# Patient Record
Sex: Female | Born: 1980 | Race: Black or African American | Hispanic: No | Marital: Single | Smoking: Never smoker
Health system: Southern US, Community
[De-identification: ages and names within clinical notes are randomized; demographics above are authoritative.]

## PROBLEM LIST (undated history)

## (undated) HISTORY — PX: CERVICAL BIOPSY  W/ LOOP ELECTRODE EXCISION: SUR135

## (undated) HISTORY — PX: WISDOM TOOTH EXTRACTION: SHX21

---
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
## 2001-05-26 ENCOUNTER — Inpatient Hospital Stay (HOSPITAL_COMMUNITY): Admission: AD | Admit: 2001-05-26 | Discharge: 2001-05-26 | Payer: Self-pay | Admitting: Obstetrics and Gynecology

## 2001-06-28 ENCOUNTER — Other Ambulatory Visit: Admission: RE | Admit: 2001-06-28 | Discharge: 2001-06-28 | Payer: Self-pay | Admitting: Obstetrics and Gynecology

## 2001-09-19 ENCOUNTER — Other Ambulatory Visit: Admission: RE | Admit: 2001-09-19 | Discharge: 2001-09-19 | Payer: Self-pay | Admitting: Obstetrics and Gynecology

## 2001-11-28 ENCOUNTER — Encounter (INDEPENDENT_AMBULATORY_CARE_PROVIDER_SITE_OTHER): Payer: Self-pay | Admitting: *Deleted

## 2001-11-28 ENCOUNTER — Ambulatory Visit (HOSPITAL_COMMUNITY): Admission: RE | Admit: 2001-11-28 | Discharge: 2001-11-28 | Payer: Self-pay | Admitting: Obstetrics and Gynecology

## 2002-03-06 ENCOUNTER — Encounter: Payer: Self-pay | Admitting: Emergency Medicine

## 2002-03-06 ENCOUNTER — Emergency Department (HOSPITAL_COMMUNITY): Admission: EM | Admit: 2002-03-06 | Discharge: 2002-03-06 | Payer: Self-pay | Admitting: Emergency Medicine

## 2004-01-09 ENCOUNTER — Inpatient Hospital Stay (HOSPITAL_COMMUNITY): Admission: AD | Admit: 2004-01-09 | Discharge: 2004-01-09 | Payer: Self-pay | Admitting: Obstetrics & Gynecology

## 2005-04-06 ENCOUNTER — Inpatient Hospital Stay (HOSPITAL_COMMUNITY): Admission: AD | Admit: 2005-04-06 | Discharge: 2005-04-06 | Payer: Self-pay | Admitting: Obstetrics & Gynecology

## 2005-07-23 ENCOUNTER — Other Ambulatory Visit: Admission: RE | Admit: 2005-07-23 | Discharge: 2005-07-23 | Payer: Self-pay | Admitting: Obstetrics and Gynecology

## 2007-03-07 ENCOUNTER — Inpatient Hospital Stay (HOSPITAL_COMMUNITY): Admission: AD | Admit: 2007-03-07 | Discharge: 2007-03-07 | Payer: Self-pay | Admitting: Obstetrics & Gynecology

## 2007-07-13 ENCOUNTER — Inpatient Hospital Stay (HOSPITAL_COMMUNITY): Admission: AD | Admit: 2007-07-13 | Discharge: 2007-07-13 | Payer: Self-pay | Admitting: Obstetrics & Gynecology

## 2007-07-15 ENCOUNTER — Inpatient Hospital Stay (HOSPITAL_COMMUNITY): Admission: AD | Admit: 2007-07-15 | Discharge: 2007-07-15 | Payer: Self-pay | Admitting: Obstetrics and Gynecology

## 2007-09-27 ENCOUNTER — Inpatient Hospital Stay (HOSPITAL_COMMUNITY): Admission: AD | Admit: 2007-09-27 | Discharge: 2007-10-01 | Payer: Self-pay | Admitting: Obstetrics and Gynecology

## 2008-03-08 ENCOUNTER — Inpatient Hospital Stay (HOSPITAL_COMMUNITY): Admission: AD | Admit: 2008-03-08 | Discharge: 2008-03-08 | Payer: Self-pay | Admitting: Obstetrics & Gynecology

## 2008-05-02 ENCOUNTER — Inpatient Hospital Stay (HOSPITAL_COMMUNITY): Admission: AD | Admit: 2008-05-02 | Discharge: 2008-05-02 | Payer: Self-pay | Admitting: Obstetrics & Gynecology

## 2008-07-27 ENCOUNTER — Inpatient Hospital Stay (HOSPITAL_COMMUNITY): Admission: AD | Admit: 2008-07-27 | Discharge: 2008-07-27 | Payer: Self-pay | Admitting: Obstetrics and Gynecology

## 2008-11-19 ENCOUNTER — Inpatient Hospital Stay (HOSPITAL_COMMUNITY): Admission: AD | Admit: 2008-11-19 | Discharge: 2008-11-19 | Payer: Self-pay | Admitting: Obstetrics & Gynecology

## 2008-11-29 ENCOUNTER — Encounter: Payer: Self-pay | Admitting: Family

## 2008-11-29 ENCOUNTER — Ambulatory Visit: Payer: Self-pay | Admitting: Obstetrics & Gynecology

## 2008-11-29 LAB — CONVERTED CEMR LAB
HCT: 34.5 % — ABNORMAL LOW (ref 36.0–46.0)
MCHC: 31.6 g/dL (ref 30.0–36.0)
MCV: 88.9 fL (ref 78.0–100.0)
Platelets: 206 10*3/uL (ref 150–400)
RBC: 3.88 M/uL (ref 3.87–5.11)

## 2008-12-12 ENCOUNTER — Ambulatory Visit: Payer: Self-pay | Admitting: Obstetrics and Gynecology

## 2008-12-19 ENCOUNTER — Ambulatory Visit: Payer: Self-pay | Admitting: Obstetrics & Gynecology

## 2008-12-26 ENCOUNTER — Ambulatory Visit: Payer: Self-pay | Admitting: Obstetrics & Gynecology

## 2008-12-30 ENCOUNTER — Ambulatory Visit: Payer: Self-pay | Admitting: Obstetrics and Gynecology

## 2008-12-30 ENCOUNTER — Inpatient Hospital Stay (HOSPITAL_COMMUNITY): Admission: AD | Admit: 2008-12-30 | Discharge: 2009-01-01 | Payer: Self-pay | Admitting: Obstetrics and Gynecology

## 2009-03-30 ENCOUNTER — Emergency Department (HOSPITAL_COMMUNITY): Admission: EM | Admit: 2009-03-30 | Discharge: 2009-03-30 | Payer: Self-pay | Admitting: Emergency Medicine

## 2009-07-01 ENCOUNTER — Emergency Department (HOSPITAL_COMMUNITY): Admission: EM | Admit: 2009-07-01 | Discharge: 2009-07-02 | Payer: Self-pay | Admitting: Emergency Medicine

## 2009-11-16 ENCOUNTER — Emergency Department (HOSPITAL_COMMUNITY): Admission: EM | Admit: 2009-11-16 | Discharge: 2009-11-16 | Payer: Self-pay | Admitting: Emergency Medicine

## 2010-04-18 ENCOUNTER — Emergency Department (HOSPITAL_COMMUNITY)
Admission: EM | Admit: 2010-04-18 | Discharge: 2010-04-18 | Payer: Self-pay | Source: Home / Self Care | Admitting: Emergency Medicine

## 2010-04-19 LAB — STREP A DNA PROBE: Group A Strep Probe: NEGATIVE

## 2010-06-06 LAB — RAPID STREP SCREEN (MED CTR MEBANE ONLY): Streptococcus, Group A Screen (Direct): NEGATIVE

## 2010-06-26 LAB — POCT URINALYSIS DIP (DEVICE)
Bilirubin Urine: NEGATIVE
Glucose, UA: NEGATIVE mg/dL
Hgb urine dipstick: NEGATIVE
Nitrite: NEGATIVE
Urobilinogen, UA: 1 mg/dL (ref 0.0–1.0)

## 2010-06-26 LAB — CBC
Hemoglobin: 12.6 g/dL (ref 12.0–15.0)
MCHC: 33.3 g/dL (ref 30.0–36.0)
RBC: 4.23 MIL/uL (ref 3.87–5.11)
WBC: 11.3 10*3/uL — ABNORMAL HIGH (ref 4.0–10.5)

## 2010-06-26 LAB — RPR: RPR Ser Ql: NONREACTIVE

## 2010-06-27 LAB — POCT URINALYSIS DIP (DEVICE)
Bilirubin Urine: NEGATIVE
Bilirubin Urine: NEGATIVE
Bilirubin Urine: NEGATIVE
Glucose, UA: NEGATIVE mg/dL
Glucose, UA: NEGATIVE mg/dL
Hgb urine dipstick: NEGATIVE
Hgb urine dipstick: NEGATIVE
Ketones, ur: NEGATIVE mg/dL
Ketones, ur: NEGATIVE mg/dL
Ketones, ur: NEGATIVE mg/dL
Nitrite: NEGATIVE
Protein, ur: NEGATIVE mg/dL
Specific Gravity, Urine: 1.02 (ref 1.005–1.030)
Specific Gravity, Urine: 1.02 (ref 1.005–1.030)
Specific Gravity, Urine: 1.01 (ref 1.005–1.030)
pH: 6 (ref 5.0–8.0)
pH: 6.5 (ref 5.0–8.0)

## 2010-06-28 LAB — GC/CHLAMYDIA PROBE AMP, GENITAL
Chlamydia, DNA Probe: NEGATIVE
GC Probe Amp, Genital: NEGATIVE

## 2010-06-28 LAB — WET PREP, GENITAL
Clue Cells Wet Prep HPF POC: NONE SEEN
Trich, Wet Prep: NONE SEEN
Yeast Wet Prep HPF POC: NONE SEEN

## 2010-07-08 LAB — CBC
HCT: 38.4 % (ref 36.0–46.0)
MCV: 92.2 fL (ref 78.0–100.0)
Platelets: 280 10*3/uL (ref 150–400)
RDW: 13.3 % (ref 11.5–15.5)
WBC: 7.7 10*3/uL (ref 4.0–10.5)

## 2010-07-08 LAB — HCG, QUANTITATIVE, PREGNANCY: hCG, Beta Chain, Quant, S: 6420 m[IU]/mL — ABNORMAL HIGH (ref <5)

## 2010-07-08 LAB — POCT PREGNANCY, URINE: Preg Test, Ur: POSITIVE

## 2010-08-05 NOTE — H&P (Signed)
Frances Hall, MACAPAGAL            ACCOUNT NO.:  [id number]   MEDICAL RECORD NO.:  [id number]          PATIENT TYPE:  INP   LOCATION:  9160                          FACILITY:  [facility]   PHYSICIAN:  Duke Salvia. Marcelle Overlie, M.D.DATE OF BIRTH:  Jun 30, 1980   DATE OF ADMISSION:  09/27/2007  DATE OF DISCHARGE:                              HISTORY & PHYSICAL   CHIEF COMPLAINT:  Elevated blood pressure.   HISTORY OF PRESENT ILLNESS:  A 30 year old G1 P0, EDD is 07/31, EGA is  36-1/2 weeks, seen in the office earlier today, BP 140/86, 1+ protein,  having some mild headaches.  Was sent to MAU for evaluation where LFTs  and platelet count were normal.  The serial blood pressures were 136/98  and others with diastolics in the high 90s with 1-2+ protein noted on  dipstick.  NST was reactive.  She is admitted now for two-stage  induction for mild preeclampsia.   Blood type is A+, rubella titer is immune.  GBS has not been done.   PAST MEDICAL HISTORY:  Please see her [facility] form for details.  She  has had mild dysplasia noted on early pregnancy Pap smear.   ALLERGIES:  None   PHYSICAL EXAMINATION:  VITAL SIGNS:  Temperature 98.2, blood pressure  136/98.  HEENT:  Unremarkable.  Supple without masses.  LUNGS:  Clear.  CARDIOVASCULAR:  Regular rate and rhythm without murmurs, rubs, gallops  noted.  BREASTS:  Not examined.  ABDOMEN:  Term fundal height.  Fetal heart rate 140.  Cervix was closed,  50% vertex, -2, membranes intact.  EXTREMITIES:  Reveal 1-2+ edema.  Reflexes 1-2+, no clonus.   IMPRESSION:  1. A 36-1/2-week intrauterine pregnancy.  2. Mild pregnancy induced hypertension.   PLAN:  Will admit for two-stage labor induction.      Richard M. Marcelle Overlie, M.D.  Electronically Signed     RMH/MEDQ  D:  09/27/2007  T:  09/27/2007  Job:  [id number]   cc:   L&D Chart

## 2010-08-08 NOTE — Discharge Summary (Signed)
Frances Hall, LOKEY NO.:  [id number]   MEDICAL RECORD NO.:  [id number]          PATIENT TYPE:  INP   LOCATION:  9107                          [facility]ACILITY:  [facility]   PHYSICIAN:  Juluis Mire, M.D.   DATE OF BIRTH:  [birth year]   DATE OF ADMISSION:  09/27/2007  DATE OF DISCHARGE:  10/01/2007                               DISCHARGE SUMMARY   ADMITTING DIAGNOSES:  1. Intrauterine pregnancy at 36-1/2 weeks estimated gestational age.  2. Pregnancy-induced hypertension.  3. Two-stage induction of labor.   DISCHARGE DIAGNOSES:  1. Status post spontaneous vaginal delivery.  2. Viable female infant.   PROCEDURES:  Spontaneous vaginal delivery with midline episiotomy with  repair.   REASON FOR ADMISSION:  Please see dictated H&P.   HOSPITAL COURSE:  The patient is a 30 year old primigravida who was  admitted to [facility] for two-stage induction of labor  after being evaluated in the office with blood pressure noted to be  140/86 of 1+ proteinuria.  The patient did complain of some mild  headache.  The patient was sent to the MAU area, where [facility] labs were  drawn and liver function tests and platelet count were within normal  limits.  Serial blood pressures were 136/98, diastolics were noted to be  in the 90s.  The patient was noted to have 1 to 2+ proteinuria dipstick.  NST was reactive.  The patient was now admitted for two-stage induction  of labor for mild preeclampsia.  On the following morning, the patient  was without complaint.  Vital signs were stable.  Blood pressure was  120s/70s.  Deep tendon reflexes were 2+.  Cervix was noted to be closed  on the internal os posterior and 60% effaced vertex at a -3 station.  The patient was now started on Pitocin for augmentation of her labor.  Throughout the day, fetal heart tones remained reactive.  Contractions  were somewhat irregular.  PIH labs had been redrawn which were within  normal limits.  Cervix  was essentially unchanged.  On the following  morning, the patient was somewhat uncomfortable with her contractions.  Blood pressures were 130-150s over 70s-90s.  The fetal heart tones  continued to be reactive.  The cervix was now 2 cm, 60% effaced, vertex  at a -2 station.  Artificial rupture of membranes was performed which  revealed clear fluid.  Internal uterine pressure catheter was placed  without difficulty.  Later that afternoon, physician was contacted  regarding while she was completely dilated and the patient was now  pushing.  Fetal heart tones began to have some prolonged deceleration  into the 90s.  The patient was able to push the vertex to a +3 station.  Consent was obtained and vacuum extractor was applied with gentle  traction.  Over the next 3 contractions, midline episiotomy was cut and  the fetus was delivered spontaneously.  With the next two contraction,  placenta was delivered spontaneously and intact.  A second-degree repair  was performed with 3-0 Vicryl.  Infant was now taken to the nursery with  Apgars of 8 at 1 minute  and 9 at 5 minutes.  Arterial cord pH was 7.20.   On postpartum day #1, the patient was without complaint.  Vital signs  were stable.  Blood pressure 140/93 to 139/85.  She denied headache,  blurred vision, and right upper quadrant pain.  Deep tendon reflexes are  2+ with one beat of clonus on the left.  Fundus firm and nontender.  Perineum was intact.  Laboratory findings revealed a hemoglobin of 9.0,  platelet count of 190,000, and WBC of 9.8.  The patient was on  labetalol, which was continued and PIH labs were ordered for the  following morning.   On postpartum day #2, the patient was without complaint.  She denied  headache, blurred vision, and right upper quadrant pain.  Blood pressure  was 140/90.  Vital signs were, otherwise, stable.  Fundus firm and  nontender.  PIH labs were within normal limits.  Deep tendon reflexes  are 1+.   Discharge instructions were reviewed and the patient was later  discharged home.   CONDITION ON DISCHARGE:  Stable.   DIET:  Regular as tolerated.   ACTIVITY:  No vaginal entry.   She is to call for headache, blurred vision, or right upper quadrant  pain.   FOLLOWUP:  She is to follow up in the office in approximately 2-3 days  for blood pressure check.   DISCHARGE MEDICATIONS:  1. Tylox #30 1 p.o. every 4 hours as needed for pain.  2. Prenatal vitamins 1 p.o. daily.      Frances Hall, N.P.      Juluis Mire, M.D.  Electronically Signed    CC/MEDQ  D:  10/21/2007  T:  10/22/2007  Job:  [id number]

## 2010-08-08 NOTE — Op Note (Signed)
   NAME:  Frances Hall, Frances Hall                      ACCOUNT NO.:  [id number]   MEDICAL RECORD NO.:  [id number]                   PATIENT TYPE:  AMB   LOCATION:  SDC                                  [facility]ILITY:  [facility]   PHYSICIAN:  Crist Fat. Rivard, M.D.              DATE OF BIRTH:  02-May-1980   DATE OF PROCEDURE:  11/28/2001  DATE OF DISCHARGE:                                 OPERATIVE REPORT   PREOPERATIVE DIAGNOSIS:  Moderate cervical dysplasia, cervical  intraepithelial neoplasia 2.   POSTOPERATIVE DIAGNOSIS:  Moderate cervical dysplasia, cervical  intraepithelial neoplasia 2.   PROCEDURE:  Loop electrosurgical excision procedure.   SURGEON:  Crist Fat. Rivard, M.D.   ANESTHESIA:  Intravenous sedation with paracervical block.   ESTIMATED BLOOD LOSS:  Minimal.   DESCRIPTION OF PROCEDURE:  After being informed of the planned procedure  with possible complications including bleeding, infection, cervical  stenosis, and cervical incompetence, informed consent is obtained.  The  patient is taken to operating room #8 and given IV sedation, placed in the  lithotomy position.  She was prepped and draped in a sterile fashion.  A  speculum is inserted, and her cervix is swabbed with acetic acid.  Colposcopy reveals the previously mentioned lesions with predominantly an  anterior lip lesion going towards the cervical canal.  We proceeded with a  paracervical block using 10 cc of lidocaine 1% with epinephrine 1:100,000,  10 cc in the usual fashion.  Using a small loop, we proceeded with LEEP  excision, first of the anterior lip, and then of the anterior lip of the  cervix.  Hemostasis was completed with cauterization of the resection bed as  well as application of Monsel's solution.  Hemostasis was adequate.  Instruments are removed.  Instrument and sponge count is complete x2.  Estimated blood loss is minimal.  The procedure is well tolerated by the  patient who is taken to the recovery room  in well and stable condition.  Both specimens were tagged and labeled for the pathologist.                                               [name], M.D.    SAR/MEDQ  D:  11/28/2001  T:  11/28/2001  Job:  [id number]

## 2010-12-18 LAB — CBC
HCT: 26 — ABNORMAL LOW
HCT: 26.4 — ABNORMAL LOW
HCT: 31 — ABNORMAL LOW
HCT: 33.4 — ABNORMAL LOW
HCT: 33.5 — ABNORMAL LOW
Hemoglobin: 11.1 — ABNORMAL LOW
Hemoglobin: 8.9 — ABNORMAL LOW
Hemoglobin: 9 — ABNORMAL LOW
MCHC: 34.1
MCHC: 34.2
MCV: 86.7
MCV: 86.9
MCV: 87.3
Platelets: 193
Platelets: 213
Platelets: 218
RBC: 2.98 — ABNORMAL LOW
RBC: 3.01 — ABNORMAL LOW
RDW: 15.4
RDW: 15.6 — ABNORMAL HIGH
WBC: 10
WBC: 10.4
WBC: 10.8 — ABNORMAL HIGH
WBC: 9.1

## 2010-12-18 LAB — URINALYSIS, ROUTINE W REFLEX MICROSCOPIC
Glucose, UA: NEGATIVE
Ketones, ur: 15 — AB
Protein, ur: 100 — AB
Urobilinogen, UA: 1

## 2010-12-18 LAB — COMPREHENSIVE METABOLIC PANEL
ALT: 12
AST: 17
Albumin: 1.9 — ABNORMAL LOW
Albumin: 2.3 — ABNORMAL LOW
Albumin: 2.6 — ABNORMAL LOW
Alkaline Phosphatase: 127 — ABNORMAL HIGH
Alkaline Phosphatase: 96
BUN: 4 — ABNORMAL LOW
BUN: 5 — ABNORMAL LOW
BUN: 8
CO2: 21
CO2: 23
Calcium: 8.6
Calcium: 8.7
Chloride: 107
Chloride: 109
Chloride: 109
Creatinine, Ser: 0.51
Creatinine, Ser: 0.79
GFR calc Af Amer: >60
GFR calc non Af Amer: >60
GFR calc non Af Amer: >60
GFR calc non Af Amer: >60
Glucose, Bld: 104 — ABNORMAL HIGH
Glucose, Bld: 99
Potassium: 3.4 — ABNORMAL LOW
Potassium: 4.1
Sodium: 139
Total Bilirubin: 0.9
Total Bilirubin: 1.1
Total Bilirubin: 1.1
Total Protein: 4.5 — ABNORMAL LOW

## 2010-12-18 LAB — LACTATE DEHYDROGENASE: LDH: 174

## 2010-12-18 LAB — URIC ACID
Uric Acid, Serum: 5.1
Uric Acid, Serum: 5.9

## 2010-12-18 LAB — STREP B DNA PROBE

## 2010-12-18 LAB — URINE MICROSCOPIC-ADD ON

## 2010-12-25 LAB — HCG, SERUM, QUALITATIVE: Preg, Serum: NEGATIVE

## 2010-12-25 LAB — GC/CHLAMYDIA PROBE AMP, GENITAL: GC Probe Amp, Genital: NEGATIVE

## 2010-12-25 LAB — WET PREP, GENITAL
Clue Cells Wet Prep HPF POC: NONE SEEN
Trich, Wet Prep: NONE SEEN
Yeast Wet Prep HPF POC: NONE SEEN

## 2010-12-25 LAB — URINALYSIS, ROUTINE W REFLEX MICROSCOPIC
Bilirubin Urine: NEGATIVE
Glucose, UA: NEGATIVE mg/dL
Hgb urine dipstick: NEGATIVE
Ketones, ur: 15 mg/dL — AB
Nitrite: NEGATIVE
Protein, ur: NEGATIVE mg/dL
Specific Gravity, Urine: 1.025 (ref 1.005–1.030)
Urobilinogen, UA: 0.2 mg/dL (ref 0.0–1.0)
pH: 5.5 (ref 5.0–8.0)

## 2010-12-25 LAB — CBC
HCT: 40.3 % (ref 36.0–46.0)
Platelets: 272 10*3/uL (ref 150–400)
RDW: 14.6 % (ref 11.5–15.5)
WBC: 5.1 10*3/uL (ref 4.0–10.5)

## 2010-12-26 LAB — URINALYSIS, ROUTINE W REFLEX MICROSCOPIC
Hgb urine dipstick: NEGATIVE
Protein, ur: NEGATIVE
Specific Gravity, Urine: 1.02
Urobilinogen, UA: 0.2

## 2010-12-26 LAB — WET PREP, GENITAL: Clue Cells Wet Prep HPF POC: NONE SEEN

## 2010-12-26 LAB — CBC
HCT: 38.4
Hemoglobin: 13.3
RBC: 4.21
WBC: 9.4

## 2010-12-26 LAB — URINE MICROSCOPIC-ADD ON

## 2010-12-26 LAB — POCT PREGNANCY, URINE: Preg Test, Ur: POSITIVE

## 2011-05-19 ENCOUNTER — Encounter (HOSPITAL_COMMUNITY): Payer: Self-pay | Admitting: *Deleted

## 2011-05-19 ENCOUNTER — Emergency Department (HOSPITAL_COMMUNITY)
Admission: EM | Admit: 2011-05-19 | Discharge: 2011-05-19 | Disposition: A | Discharge status: Home / Self Care | Payer: Medicaid Other | Attending: Emergency Medicine | Admitting: Emergency Medicine

## 2011-05-19 DIAGNOSIS — R51 Headache: Secondary | ICD-10-CM | POA: Insufficient documentation

## 2011-05-19 DIAGNOSIS — N390 Urinary tract infection, site not specified: Secondary | ICD-10-CM | POA: Insufficient documentation

## 2011-05-19 DIAGNOSIS — R197 Diarrhea, unspecified: Secondary | ICD-10-CM | POA: Insufficient documentation

## 2011-05-19 DIAGNOSIS — R5381 Other malaise: Secondary | ICD-10-CM | POA: Insufficient documentation

## 2011-05-19 DIAGNOSIS — R109 Unspecified abdominal pain: Secondary | ICD-10-CM | POA: Insufficient documentation

## 2011-05-19 DIAGNOSIS — R5383 Other fatigue: Secondary | ICD-10-CM | POA: Insufficient documentation

## 2011-05-19 LAB — URINALYSIS, ROUTINE W REFLEX MICROSCOPIC
Glucose, UA: NEGATIVE mg/dL
Hgb urine dipstick: NEGATIVE
Protein, ur: NEGATIVE mg/dL
Specific Gravity, Urine: 1.029 (ref 1.005–1.030)
pH: 5.5 (ref 5.0–8.0)

## 2011-05-19 LAB — URINE MICROSCOPIC-ADD ON

## 2011-05-19 LAB — POCT PREGNANCY, URINE: Preg Test, Ur: NEGATIVE

## 2011-05-19 MED ORDER — SULFAMETHOXAZOLE-TRIMETHOPRIM 800-160 MG PO TABS: 1.0000 | ORAL_TABLET | Freq: Two times a day (BID) | ORAL | Status: AC

## 2011-05-19 MED ORDER — ACETAMINOPHEN 325 MG PO TABS
650.0000 mg | ORAL_TABLET | Freq: Once | ORAL | Status: AC
  Administered 2011-05-19: 650 mg via ORAL
  Filled 2011-05-19: qty 2

## 2011-05-19 MED ORDER — ONDANSETRON 8 MG PO TBDP
8.0000 mg | ORAL_TABLET | Freq: Once | ORAL | Status: AC
  Administered 2011-05-19: 8 mg via ORAL
  Filled 2011-05-19: qty 1

## 2011-05-19 NOTE — ED Notes (Signed)
Pt states "they were having diarrhea 1st, then I started getting it, I had the h/a, had nausea"

## 2011-05-19 NOTE — Progress Notes (Signed)
Pt states she and son's pcp is [facility] pediatrician

## 2011-05-19 NOTE — ED Notes (Signed)
Pt tolerating fluids well. 

## 2011-05-19 NOTE — ED Provider Notes (Signed)
History     CSN: [id number]  Arrival date & time 05/19/11  1314   First MD Initiated Contact with Patient 05/19/11 1515      Chief Complaint  Patient presents with  . Headache  . Generalized Body Aches  . Emesis     Patient is a 31 y.o. female presenting with diarrhea. The history is provided by the patient.  Diarrhea The primary symptoms include fatigue, abdominal pain, nausea, diarrhea and myalgias. Episode onset: several days ago. The onset was gradual. The problem has been gradually worsening.  The illness is also significant for chills.  Pt reports fatigue, nonbloody diarrhea, chills/myalgias, mild headache for past several days She also reports mild abdominal cramping No dysuria, no vaginal bleeding +sick contact No significant cough reported  PMH - none  History reviewed. No pertinent past surgical history.  No family history on file.  History  Substance Use Topics  . Smoking status: Passive Smoker  . Smokeless tobacco: Not on file  . Alcohol Use: No    OB History    Grav Para Term Preterm Abortions TAB SAB Ect Mult Living                  Review of Systems  Constitutional: Positive for chills and fatigue.  Gastrointestinal: Positive for nausea, abdominal pain and diarrhea.  Musculoskeletal: Positive for myalgias.    Allergies  Review of patient's allergies indicates no known allergies.  Home Medications  No current outpatient prescriptions on file.  BP 120/53  Pulse 97  Temp 97.8 F (36.6 C)  Resp 20  Wt 130 lb (58.968 kg)  SpO2 99%  Physical Exam CONSTITUTIONAL: Well developed/well nourished HEAD AND FACE: Normocephalic/atraumatic EYES: EOMI/PERRL, no scleral icterus ENMT: Mucous membranes moist NECK: supple no meningeal signs SPINE:entire spine nontender CV: S1/S2 noted, no murmurs/rubs/gallops noted LUNGS: Lungs are clear to auscultation bilaterally, no apparent distress ABDOMEN: soft, nontender, no rebound or guarding, +BS GU:no  cva tenderness NEURO: Pt is awake/alert, moves all extremitiesx4 EXTREMITIES: pulses normal, full ROM SKIN: warm, color normal PSYCH: no abnormalities of mood noted  ED Course  Procedures   Labs Reviewed  URINALYSIS, ROUTINE W REFLEX MICROSCOPIC   3:30 PM Pt with mild headache, diarrhea/fatigue, +sick contacts She is nontoxic and well appearing Will try PO challenge and reassess  UTI noted Pt well appearing, resting comfortably Stable for d/c Discussed strict return precautions  The patient appears reasonably screened and/or stabilized for discharge and I doubt any other medical condition or other [facility] requiring further screening, evaluation, or treatment in the ED at this time prior to discharge.    MDM  Nursing notes reviewed and considered in documentation All labs/vitals reviewed and considered         Joya Gaskins, MD 05/19/11 1806

## 2011-05-19 NOTE — ED Notes (Signed)
Patient given discharge instructions, information, prescriptions, and diet order. Patient states that they adequately understand discharge information given and to return to ED if symptoms return or worsen.     

## 2012-12-14 ENCOUNTER — Emergency Department (HOSPITAL_COMMUNITY)
Admission: EM | Admit: 2012-12-14 | Discharge: 2012-12-14 | Disposition: A | Discharge status: Home / Self Care | Payer: Medicaid Other | Attending: Emergency Medicine | Admitting: Emergency Medicine

## 2012-12-14 ENCOUNTER — Encounter (HOSPITAL_COMMUNITY): Payer: Self-pay | Admitting: Physical Medicine and Rehabilitation

## 2012-12-14 DIAGNOSIS — R300 Dysuria: Secondary | ICD-10-CM | POA: Insufficient documentation

## 2012-12-14 DIAGNOSIS — R609 Edema, unspecified: Secondary | ICD-10-CM | POA: Insufficient documentation

## 2012-12-14 DIAGNOSIS — H9203 Otalgia, bilateral: Secondary | ICD-10-CM

## 2012-12-14 DIAGNOSIS — R350 Frequency of micturition: Secondary | ICD-10-CM

## 2012-12-14 DIAGNOSIS — H9209 Otalgia, unspecified ear: Secondary | ICD-10-CM | POA: Insufficient documentation

## 2012-12-14 DIAGNOSIS — Z3202 Encounter for pregnancy test, result negative: Secondary | ICD-10-CM | POA: Insufficient documentation

## 2012-12-14 LAB — URINALYSIS, ROUTINE W REFLEX MICROSCOPIC
Glucose, UA: NEGATIVE mg/dL
Leukocytes, UA: NEGATIVE
Nitrite: NEGATIVE
Protein, ur: NEGATIVE mg/dL
pH: 5.5 (ref 5.0–8.0)

## 2012-12-14 LAB — POCT PREGNANCY, URINE: Preg Test, Ur: NEGATIVE

## 2012-12-14 NOTE — ED Notes (Signed)
Pt presents to department for evaluation of bilateral earache x1 week and frequent urination. Pt states pain to both ears, no drainage noted. Also states frequent urination, denies vaginal symptoms. 7/10 ear pain at the time. Pt is alert and oriented x4.

## 2012-12-14 NOTE — ED Provider Notes (Signed)
CSN: [id number]     Arrival date & time 12/14/12  0905 History  This chart was scribed for non-physician practitioner Junius Finner, PA-C, working with Loren Racer, MD by Dorothey Baseman, ED Scribe. This patient was seen in room TR05C/TR05C and the patient's care was started at 9:30 AM.    Chief Complaint  Patient presents with  . Otalgia  . Urinary Frequency   The history is provided by the patient. No language interpreter was used.   HPI Comments: Frances Hall is a 32 y.o. female who presents to the Emergency Department complaining of an aching, throbbing pain to the bilateral ears, right worst than left, 7/10 currently, onset 1 week ago. She denies nasal congestion, cough, fever. She states that she has not taken any medications at home to treat the symptoms. She reports mild seasonal allergies.   Patient also reports urinary frequency with associated urethral irritation, dysuria, and cramping onset 2 weeks ago. Patient reports that she has had an asymptomatic bladder infection before. She denies abdominal pain, nausea, vomiting. Denies vaginal discharge or pain. Patient reports that her LMP was 1 week ago. Patient states that she is not currently on oral contraception and that her Implanon ran out in December, 2013.  No past medical history on file. No past surgical history on file. No family history on file. History  Substance Use Topics  . Smoking status: Never Smoker   . Smokeless tobacco: Not on file  . Alcohol Use: No   OB History   Grav Para Term Preterm Abortions TAB SAB Ect Mult Living                 Review of Systems  Constitutional: Negative for fever.  HENT: Positive for ear pain. Negative for congestion.   Respiratory: Negative for cough.   Gastrointestinal: Negative for nausea, vomiting and abdominal pain.  Genitourinary: Positive for dysuria and frequency.  All other systems reviewed and are negative.    Allergies  Review of patient's allergies  indicates no known allergies.  Home Medications  No current outpatient prescriptions on file.   Triage Vitals: BP 106/70  Pulse 66  Temp(Src) 97.5 F (36.4 C) (Oral)  Resp 18  SpO2 100%  Physical Exam  Nursing note and vitals reviewed. Constitutional: She appears well-developed and well-nourished. No distress.  HENT:  Head: Normocephalic and atraumatic.  Right Ear: Hearing, tympanic membrane, external ear and ear canal normal.  Left Ear: Hearing, tympanic membrane, external ear and ear canal normal.  Nose: Mucosal edema present.  Mouth/Throat: Uvula is midline, oropharynx is clear and moist and mucous membranes are normal.  Nl ear canals, nl TMs bilaterally. No erythema or bulging. No cerumen impaction.   Eyes: Conjunctivae are normal. No scleral icterus.  Neck: Normal range of motion.  Cardiovascular: Normal rate, regular rhythm and normal heart sounds.   Pulmonary/Chest: Effort normal and breath sounds normal. No respiratory distress. She has no wheezes. She has no rales. She exhibits no tenderness.  Abdominal: Soft. Bowel sounds are normal. She exhibits no distension and no mass. There is no tenderness. There is no rebound and no guarding.  Soft, non-distended, non-tender.   Musculoskeletal: Normal range of motion.  Neurological: She is alert.  Skin: Skin is warm and dry. She is not diaphoretic.    ED Course  Procedures (including critical care time)  DIAGNOSTIC STUDIES: Oxygen Saturation is 100% on room air, normal by my interpretation.    COORDINATION OF CARE: 9:34AM- Discussed that there are  no signs of an ear infection at this time and that symptoms are likely due to seasonal allergies. Advised patient to take OTC antihistamines and to clean out the ears with a mixture of warm water and peroxide. Discussed treatment plan with patient at bedside and patient verbalized agreement.   9:55AM- Discussed that UA show no signs of infection at this time and that "discomfort"  symptoms could be due to her Implanon running out. Will refer patient to the [facility]. Discussed treatment plan with patient at bedside and patient verbalized agreement.    Labs Review Labs Reviewed  URINALYSIS, ROUTINE W REFLEX MICROSCOPIC  POCT PREGNANCY, URINE   Imaging Review No results found.  MDM   1. Ear pain, bilateral   2. Urinary frequency     I personally performed the services described in this documentation, which was scribed in my presence. The recorded information has been reviewed and is accurate.    Junius Finner, PA-C 12/14/12 1612

## 2012-12-15 NOTE — ED Provider Notes (Signed)
Medical screening examination/treatment/procedure(s) were performed by non-physician practitioner and as supervising physician I was immediately available for consultation/collaboration.   Corliss Lamartina, MD 12/15/12 1251 

## 2013-08-31 ENCOUNTER — Ambulatory Visit (INDEPENDENT_AMBULATORY_CARE_PROVIDER_SITE_OTHER): Payer: Medicaid Other | Admitting: Family Medicine

## 2013-08-31 ENCOUNTER — Encounter: Payer: Self-pay | Admitting: Family Medicine

## 2013-08-31 VITALS — BP 116/49 | HR 78 | Temp 98.5°F | Ht 59.0 in | Wt 134.4 lb

## 2013-08-31 DIAGNOSIS — R5381 Other malaise: Secondary | ICD-10-CM

## 2013-08-31 DIAGNOSIS — Z0000 Encounter for general adult medical examination without abnormal findings: Secondary | ICD-10-CM

## 2013-08-31 DIAGNOSIS — R5383 Other fatigue: Principal | ICD-10-CM

## 2013-08-31 HISTORY — DX: Encounter for general adult medical examination without abnormal findings: Z00.00

## 2013-08-31 LAB — CBC WITH DIFFERENTIAL/PLATELET
Basophils Absolute: 0 10*3/uL (ref 0.0–0.1)
Basophils Relative: 0 % (ref 0–1)
EOS PCT: 7 % — AB (ref 0–5)
Eosinophils Absolute: 0.4 10*3/uL (ref 0.0–0.7)
HEMATOCRIT: 38.2 % (ref 36.0–46.0)
Hemoglobin: 13.1 g/dL (ref 12.0–15.0)
LYMPHS ABS: 1.5 10*3/uL (ref 0.7–4.0)
Lymphocytes Relative: 30 % (ref 12–46)
MCH: 30.3 pg (ref 26.0–34.0)
MCHC: 34.3 g/dL (ref 30.0–36.0)
MCV: 88.2 fL (ref 78.0–100.0)
MONO ABS: 0.4 10*3/uL (ref 0.1–1.0)
Monocytes Relative: 7 % (ref 3–12)
Neutro Abs: 2.9 10*3/uL (ref 1.7–7.7)
Neutrophils Relative %: 56 % (ref 43–77)
Platelets: 299 10*3/uL (ref 150–400)
RBC: 4.33 MIL/uL (ref 3.87–5.11)
RDW: 13.5 % (ref 11.5–15.5)
WBC: 5.1 10*3/uL (ref 4.0–10.5)

## 2013-08-31 NOTE — Assessment & Plan Note (Signed)
A: Unclear etiology at this time; possibly related to orthostatic hypotension (BP okay today), vs anemia (but sx happen erratically) vs hypoglycemia (pt unclear whether sx happen around food intake); no red flags at this time, no murmurs or irreg rhythm on physical exam P: instructed to increase PO intake to q3-4 hrs Carry water or non caffeinated beverage during hot months Make note of sx and situation when they occur CBC with diff and iron today

## 2013-08-31 NOTE — Patient Instructions (Addendum)
Frances Hall, it was great to see you today!  I am pleased to hear that things are going well for you. It was very nice to meet you Pay attention to the dizzy symptoms and if you can make a note about events surrounding symptoms Please make sure to drink plenty of water during hot time Make sure you are eating (at least small snacks) every 3-4 hours Please make appointment for a PAP smear within the next several months  Looking forward to seeing you soon [name], MD

## 2013-08-31 NOTE — Assessment & Plan Note (Signed)
Patient to return for PAP smear  Has hx of LEEP without appropriate f/up (worrisome) No sx of sweats, weight loss to suggest malignancy at this time

## 2013-08-31 NOTE — Progress Notes (Signed)
Patient ID: Frances Hall, female   DOB: [birth year], 33 y.o.   MRN: [id number]   [facility] Bernadene Bell, MD Phone: [phone]  Subjective:  Frances Hall is a 33 y.o F who presents as a new pt to establish care  HCM -Had high blood pressure with pregnancy (at that time met criteria for mild pre-E before guidelines changed definition) -did not have to take medication post delivery of child -not currently sexually active, not interested in [facility] at this time -exercises daily (cardio), tries to make good food choices -Had hx of LEEP (in 2003) which was apparently nml, but is uncertain of when she last had PAP smear f/up  Fatigue -occasionally will suddenly feel completely exhausted -is unclear circumstances happening around event since this happens very rarely -feels that maybe her blood pressure drops? -does also wonder if she has low blood counts, but does not recall history of anemia -denies CP, palps, SOB, does not limit exercise, no episodes of LOC  All systems were reviewed and were negative unless otherwise noted in the HPI  Past Medical History There are no active problems to display for this patient.  Reviewed problem list.  Medications- reviewed and updated Chief complaint-noted No additions to family history Social history- patient is a never smoker  Objective: BP 116/49  Pulse 78  Temp(Src) 98.5 F (36.9 C) (Oral)  Ht _0  (1.499 m)  Wt 134 lb 6.4 oz (60.963 kg)  BMI 27.13 kg/m2 Gen: NAD, alert, cooperative with exam HEENT: NCAT, EOMI, PERRL, pale conjunctiva, TMs nml Neck: FROM, supple CV: RRR, good S1/S2, no murmur, cap refill <3 Resp: CTABL, no wheezes, non-labored Abd: SNTND, BS present, no guarding or organomegaly Ext: No edema, warm, normal tone, moves UE/LE spontaneously Neuro: Alert and oriented, No gross deficits Skin: no rashes no lesions  Assessment/Plan: See problem based a/p

## 2013-09-01 ENCOUNTER — Encounter: Payer: Self-pay | Admitting: Family Medicine

## 2013-09-01 LAB — IRON: Iron: 87 ug/dL (ref 42–145)

## 2013-10-06 ENCOUNTER — Other Ambulatory Visit (HOSPITAL_COMMUNITY)
Admission: RE | Admit: 2013-10-06 | Discharge: 2013-10-06 | Disposition: A | Discharge status: Home / Self Care | Payer: Medicaid Other | Source: Ambulatory Visit | Attending: Family Medicine | Admitting: Family Medicine

## 2013-10-06 ENCOUNTER — Encounter: Payer: Self-pay | Admitting: Family Medicine

## 2013-10-06 ENCOUNTER — Ambulatory Visit (INDEPENDENT_AMBULATORY_CARE_PROVIDER_SITE_OTHER): Payer: Medicaid Other | Admitting: Family Medicine

## 2013-10-06 VITALS — BP 107/68 | HR 76 | Temp 98.1°F | Wt 136.0 lb

## 2013-10-06 DIAGNOSIS — N921 Excessive and frequent menstruation with irregular cycle: Secondary | ICD-10-CM

## 2013-10-06 DIAGNOSIS — Z124 Encounter for screening for malignant neoplasm of cervix: Secondary | ICD-10-CM | POA: Insufficient documentation

## 2013-10-06 DIAGNOSIS — R87810 Cervical high risk human papillomavirus (HPV) DNA test positive: Secondary | ICD-10-CM | POA: Insufficient documentation

## 2013-10-06 DIAGNOSIS — N920 Excessive and frequent menstruation with regular cycle: Secondary | ICD-10-CM | POA: Insufficient documentation

## 2013-10-06 DIAGNOSIS — Z1151 Encounter for screening for human papillomavirus (HPV): Secondary | ICD-10-CM | POA: Insufficient documentation

## 2013-10-06 LAB — POCT URINE PREGNANCY: Preg Test, Ur: NEGATIVE

## 2013-10-06 NOTE — Patient Instructions (Signed)
Ms Frances Hall it was great to see you today!  I am sorry to hear that you are having trouble with your cycle Please call if things do not improve after a few days You may take Ibuprofen to help with bleeding We will call you with the results of your PAP  If you have bruising, bleeding, or intense discomfort or fevers chills nausea or vomiting please call immediately  Looking forward to seeing you soon Charlane FerrettiMelanie C Sydnee Lamour, MD

## 2013-10-06 NOTE — Progress Notes (Signed)
Patient ID: Frances Hall, female   DOB: 10/16/1980, 33 y.o.   MRN: [id number]   [facility] Frances FerrettiMelanie C Valoree Agent, MD Phone: [phone]  Subjective:  Ms Frances Hall is a 33 y.o F who presents for menstrual bleeding   # Menstrual bleeding  -this cycle started about the 11th of June; had spotting at that time; has had bleeding every single day since that time -became heavy 2 days ago; described heavy as wearing a pad with 2 changes , occasionally will pass clots but has not passed any lately -has not had intercourse for the past 4 years -denies STDs -has hx of LEEP without adequate f/up -has had nexplanon in arm for the last 4 years  All systems were reviewed and were negative unless otherwise noted in the HPI  Past Medical History Patient Active Problem List   Diagnosis Date Noted  . Other malaise and fatigue 08/31/2013  . Healthcare maintenance 08/31/2013   Reviewed problem list.  Medications- reviewed and updated Chief complaint-noted No additions to family history Social history- patient is a never smoker  Objective: BP 107/68  Pulse 76  Temp(Src) 98.1 F (36.7 C) (Oral)  Wt 136 lb (61.689 kg)  LMP 10/06/2013 Gen: NAD, alert, cooperative with exam HEENT: NCAT, EOMI Neck: FROM, supple Abd: SNTND, BS present, no guarding or organomegaly GU: vaginal vault visualized with blood coming from cervical os, area of erythematous cervix at 12oclock (suspect this is where she had her prior biopsy); no lacerations seen in the walls, no discharge Neuro: Alert and oriented, No gross deficits Skin: no rashes no lesions  PROCEDURE NOTE: NEXPLANON  REMOVAL Patient given informed consent and signed copy in the chart. Left arm area prepped and draped in the usual sterile fashion. 1,5 cc of 1% lidocaine with epinephrine used for local anesthesia. A small stab incision was made close to the nexplanon with scalpel. Hemostats were used to withdraw the nexplanon. A small  bandage was applied over a steri strip.  A pressure bandage was applied. No complications.  Patient given follow up instructions should she experience redness, swelling at sight or fever in the next 24 hours. Patient was reminded this totally removes her nexplanon contraceptive devise. (she can now potentially conceive)   Assessment/Plan: See problem based a/p

## 2013-10-06 NOTE — Assessment & Plan Note (Signed)
Broad differential  Possibly related to implanon device in arm for the past 4 year Also has known hx of LEEP without appropriate f/up No lesions seen in the vag vault Pt hemodynamically stable S/p removal of device (measuring 4 cm) Pt declines OCPs at this time Cycle described as "heavy" but appears to be irreg spotting PAP performed, send for HPV May eventually need cervical biospy  Pt to call and RTC in 2 weeks

## 2013-10-09 LAB — CYTOLOGY - PAP

## 2013-10-11 ENCOUNTER — Telehealth: Payer: Self-pay | Admitting: Family Medicine

## 2013-10-11 NOTE — Telephone Encounter (Signed)
Pt needs to be scheduled for colpo clinic given ascus and high risk HPV Pt aware [facility] MD

## 2013-10-17 ENCOUNTER — Ambulatory Visit: Payer: Medicaid Other | Admitting: Family Medicine

## 2013-10-19 ENCOUNTER — Ambulatory Visit (INDEPENDENT_AMBULATORY_CARE_PROVIDER_SITE_OTHER): Payer: Medicaid Other | Admitting: Family Medicine

## 2013-10-19 ENCOUNTER — Encounter (HOSPITAL_COMMUNITY): Payer: Self-pay | Admitting: Emergency Medicine

## 2013-10-19 ENCOUNTER — Emergency Department (HOSPITAL_COMMUNITY): Payer: Medicaid Other

## 2013-10-19 ENCOUNTER — Emergency Department (HOSPITAL_COMMUNITY)
Admission: EM | Admit: 2013-10-19 | Discharge: 2013-10-19 | Disposition: A | Discharge status: Home / Self Care | Payer: Medicaid Other | Attending: Emergency Medicine | Admitting: Emergency Medicine

## 2013-10-19 ENCOUNTER — Encounter: Payer: Self-pay | Admitting: Family Medicine

## 2013-10-19 VITALS — BP 123/74 | HR 83 | Temp 98.8°F | Ht 59.0 in | Wt 135.6 lb

## 2013-10-19 DIAGNOSIS — R300 Dysuria: Secondary | ICD-10-CM

## 2013-10-19 DIAGNOSIS — R109 Unspecified abdominal pain: Secondary | ICD-10-CM | POA: Insufficient documentation

## 2013-10-19 DIAGNOSIS — B3731 Acute candidiasis of vulva and vagina: Secondary | ICD-10-CM | POA: Insufficient documentation

## 2013-10-19 DIAGNOSIS — N949 Unspecified condition associated with female genital organs and menstrual cycle: Secondary | ICD-10-CM | POA: Insufficient documentation

## 2013-10-19 DIAGNOSIS — B373 Candidiasis of vulva and vagina: Secondary | ICD-10-CM

## 2013-10-19 DIAGNOSIS — R350 Frequency of micturition: Secondary | ICD-10-CM | POA: Insufficient documentation

## 2013-10-19 DIAGNOSIS — R102 Pelvic and perineal pain: Secondary | ICD-10-CM

## 2013-10-19 DIAGNOSIS — N912 Amenorrhea, unspecified: Secondary | ICD-10-CM

## 2013-10-19 DIAGNOSIS — Z9889 Other specified postprocedural states: Secondary | ICD-10-CM | POA: Insufficient documentation

## 2013-10-19 DIAGNOSIS — R87611 Atypical squamous cells cannot exclude high grade squamous intraepithelial lesion on cytologic smear of cervix (ASC-H): Secondary | ICD-10-CM

## 2013-10-19 DIAGNOSIS — Z3202 Encounter for pregnancy test, result negative: Secondary | ICD-10-CM | POA: Diagnosis not present

## 2013-10-19 LAB — URINALYSIS, ROUTINE W REFLEX MICROSCOPIC
BILIRUBIN URINE: NEGATIVE
Glucose, UA: NEGATIVE mg/dL
Hgb urine dipstick: NEGATIVE
Ketones, ur: NEGATIVE mg/dL
Leukocytes, UA: NEGATIVE
Nitrite: NEGATIVE
PH: 7 (ref 5.0–8.0)
Protein, ur: NEGATIVE mg/dL
SPECIFIC GRAVITY, URINE: 1.023 (ref 1.005–1.030)
Urobilinogen, UA: 1 mg/dL (ref 0.0–1.0)

## 2013-10-19 LAB — POCT URINALYSIS DIPSTICK
Bilirubin, UA: NEGATIVE
Blood, UA: NEGATIVE
GLUCOSE UA: NEGATIVE
Ketones, UA: NEGATIVE
Leukocytes, UA: NEGATIVE
NITRITE UA: NEGATIVE
Protein, UA: NEGATIVE
Spec Grav, UA: >=1.03
Urobilinogen, UA: 0.2
pH, UA: 5.5

## 2013-10-19 LAB — CBC WITH DIFFERENTIAL/PLATELET
Basophils Absolute: 0 10*3/uL (ref 0.0–0.1)
Basophils Relative: 0 % (ref 0–1)
EOS ABS: 0.3 10*3/uL (ref 0.0–0.7)
Eosinophils Relative: 5 % (ref 0–5)
HCT: 40.6 % (ref 36.0–46.0)
Hemoglobin: 13.6 g/dL (ref 12.0–15.0)
LYMPHS ABS: 1.5 10*3/uL (ref 0.7–4.0)
LYMPHS PCT: 27 % (ref 12–46)
MCH: 30.5 pg (ref 26.0–34.0)
MCHC: 33.5 g/dL (ref 30.0–36.0)
MCV: 91 fL (ref 78.0–100.0)
Monocytes Absolute: 0.2 10*3/uL (ref 0.1–1.0)
Monocytes Relative: 4 % (ref 3–12)
Neutro Abs: 3.6 10*3/uL (ref 1.7–7.7)
Neutrophils Relative %: 64 % (ref 43–77)
PLATELETS: 251 10*3/uL (ref 150–400)
RBC: 4.46 MIL/uL (ref 3.87–5.11)
RDW: 13.2 % (ref 11.5–15.5)
WBC: 5.6 10*3/uL (ref 4.0–10.5)

## 2013-10-19 LAB — COMPREHENSIVE METABOLIC PANEL
ALBUMIN: 3.8 g/dL (ref 3.5–5.2)
ALK PHOS: 67 U/L (ref 39–117)
ALT: 19 U/L (ref 0–35)
AST: 17 U/L (ref 0–37)
Anion gap: 12 (ref 5–15)
BUN: 13 mg/dL (ref 6–23)
CO2: 27 mEq/L (ref 19–32)
Calcium: 9.7 mg/dL (ref 8.4–10.5)
Chloride: 103 mEq/L (ref 96–112)
Creatinine, Ser: 0.8 mg/dL (ref 0.50–1.10)
GFR calc non Af Amer: >90 mL/min (ref >90)
GLUCOSE: 115 mg/dL — AB (ref 70–99)
POTASSIUM: 3.9 meq/L (ref 3.7–5.3)
SODIUM: 142 meq/L (ref 137–147)
TOTAL PROTEIN: 7.2 g/dL (ref 6.0–8.3)
Total Bilirubin: 0.8 mg/dL (ref 0.3–1.2)

## 2013-10-19 LAB — POCT URINE PREGNANCY: PREG TEST UR: NEGATIVE

## 2013-10-19 LAB — WET PREP, GENITAL
Clue Cells Wet Prep HPF POC: NONE SEEN
TRICH WET PREP: NONE SEEN

## 2013-10-19 LAB — PREGNANCY, URINE: Preg Test, Ur: NEGATIVE

## 2013-10-19 MED ORDER — KETOROLAC TROMETHAMINE 60 MG/2ML IM SOLN
60.0000 mg | Freq: Once | INTRAMUSCULAR | Status: AC
  Administered 2013-10-19: 60 mg via INTRAMUSCULAR
  Filled 2013-10-19: qty 2

## 2013-10-19 MED ORDER — FLUCONAZOLE 150 MG PO TABS
150.0000 mg | ORAL_TABLET | Freq: Once | ORAL | Status: AC
  Administered 2013-10-19: 150 mg via ORAL
  Filled 2013-10-19: qty 1

## 2013-10-19 MED ORDER — TRAMADOL HCL 50 MG PO TABS: 50.0000 mg | ORAL_TABLET | Freq: Four times a day (QID) | ORAL | Status: DC | PRN

## 2013-10-19 MED ORDER — NAPROXEN 500 MG PO TABS: 500.0000 mg | ORAL_TABLET | Freq: Two times a day (BID) | ORAL | Status: DC

## 2013-10-19 NOTE — ED Notes (Addendum)
Pt c/o abd pain x 1 day that has been getting worse. Pt denies n/v/d. Pt states she has been on her period for the past month not bleeding at the moment.

## 2013-10-19 NOTE — ED Notes (Signed)
Highest bladder scan reading was 63mL

## 2013-10-19 NOTE — Patient Instructions (Signed)
The urinalysis is normal and does not show any evidence of infection. Please make an appointment with Dr. Michail JewelsMarsh to discuss this issue further.  You can expect some discomfort and bleeding for the next 24 hours. If heavy bleeding occurs and does not stop after 1 day please call the clinic.   You can expect a letter in the mail discussing the results if they are normal, if further workup needs to be done someone from clinic will call you.

## 2013-10-19 NOTE — Progress Notes (Signed)
Patient ID: Frances LacksRenee M Sylvester, female   DOB: 01/30/1981, 33 y.o.   MRN: [id number]  Chief Complaint  Patient presents with  . Colposcopy    HPI Frances LacksRenee M Capasso is a 33 y.o. female.  HPI  Indications: Pap smear on July 2015 showed: ASC cannot exclude high grade lesion [facility] / AGC. Previous colposcopy: done ~10 years ago. Prior cervical treatment: LEEP.  No past medical history on file.  Past Surgical History  Procedure Laterality Date  . Cervical biopsy  w/ loop electrode excision Bilateral    Review of Systems Review of Systems  Blood pressure 123/74, pulse 83, temperature 98.8 F (37.1 C), temperature source Oral, height 4\' 11"  (1.499 m), weight 135 lb 9.6 oz (61.508 kg), last menstrual period 10/06/2013.  Physical Exam  Patient given informed consent, signed copy in the chart.  Placed in lithotomy position. Cervix viewed with speculum and colposcope after application of acetic acid and lugol's iodine.   Colposcopy adequate (entire squamocolumnar junctions seen  in entirety) ?  Yes Acetowhite lesions?Yes  Punctation?no Mosaicism?  no Abnormal vasculature?  12 o'clock Biopsies?12, 8, and 6 o'clock ECC?done but difficult due to stenosed cervical os Complications? Minimal bleeding controlled with monsel's   Data Reviewed Pap smear, pregnancy test  Assessment/Plan:    Specimens labelled and sent to Pathology.     Tawni CarnesWight, Jolynn Bajorek 10/19/2013, 10:22 AM  [facility] ATTENDING  NOTE Nicolette BangKehinde Eniola,MD I  have seen and examined this patient, reviewed their chart. I have discussed this patient with the resident. I agree with the resident's findings, assessment and care plan.

## 2013-10-19 NOTE — Discharge Instructions (Signed)
Naprosyn and ultram for pain. Try heating pads. Drink plenty of fluids. Follow up with your symptoms not improving.   Candidal Vulvovaginitis Candidal vulvovaginitis is an infection of the vagina and vulva. The vulva is the skin around the opening of the vagina. This may cause itching and discomfort in and around the vagina.  HOME CARE  Only take medicine as told by your doctor.  Do not have sex (intercourse) until the infection is healed or as told by your doctor.  Practice safe sex.  Tell your sex partner about your infection.  Do not douche or use tampons.  Wear cotton underwear. Do not wear tight pants or panty hose.  Eat yogurt. This may help treat and prevent yeast infections. GET HELP RIGHT AWAY IF:   You have a fever.  Your problems get worse during treatment or do not get better in 3 days.  You have discomfort, irritation, or itching in your vagina or vulva area.  You have pain after sex.  You start to get belly (abdominal) pain. MAKE SURE YOU:  Understand these instructions.  Will watch your condition.  Will get help right away if you are not doing well or get worse. Document Released: 06/05/2008 Document Revised: 03/14/2013 Document Reviewed: 06/05/2008 [facility] Patient Information 2015 [place], [place]. This information is not intended to replace advice given to you by your health care provider. Make sure you discuss any questions you have with your health care provider.

## 2013-10-19 NOTE — ED Provider Notes (Signed)
CSN: [id number]     Arrival date & time 10/19/13  1818 History   First MD Initiated Contact with Patient 10/19/13 2020     Chief Complaint  Patient presents with  . Abdominal Pain     (Consider location/radiation/quality/duration/timing/severity/associated sxs/prior Treatment) HPI Frances Hall is a 33 y.o. female who presents to emergency department with complaint of suprapubic pain. Patient states she has had pain for 2 days. States she's having urinary urgency and frequency. She denies fever chills. She states today she had a procedure where they did a colposcopy with cervical biopsy. She states pain worsened after that. She denies vaginal discharge or bleeding. Her last period was a month ago. She states has not had intercourse in close to 3 years. No history of the same.     Marland KitchenHistory reviewed. No pertinent past medical history. Past Surgical History  Procedure Laterality Date  . Cervical biopsy  w/ loop electrode excision Bilateral    Family History  Problem Relation Age of Onset  . Hypertension Mother   . Hypertension Sister   . Hypertension Maternal Grandmother   . Stroke Maternal Grandmother    History  Substance Use Topics  . Smoking status: Never Smoker   . Smokeless tobacco: Not on file  . Alcohol Use: No   OB History   Grav Para Term Preterm Abortions TAB SAB Ect Mult Living                 Review of Systems  Constitutional: Negative for fever and chills.  Respiratory: Negative for cough, chest tightness and shortness of breath.   Cardiovascular: Negative for chest pain, palpitations and leg swelling.  Gastrointestinal: Positive for abdominal pain. Negative for nausea, vomiting and diarrhea.  Genitourinary: Positive for urgency, frequency and pelvic pain. Negative for dysuria, flank pain, vaginal bleeding, vaginal discharge and vaginal pain.  Musculoskeletal: Negative for arthralgias, myalgias, neck pain and neck stiffness.  Skin: Negative for rash.   Neurological: Negative for dizziness, weakness and headaches.  All other systems reviewed and are negative.     Allergies  Review of patient's allergies indicates no known allergies.  Home Medications   Prior to Admission medications   Not on File   BP 115/67  Pulse 72  Temp(Src) 98.7 F (37.1 C) (Oral)  Resp 18  SpO2 99%  LMP 10/06/2013 Physical Exam  Nursing note and vitals reviewed. Constitutional: She appears well-developed and well-nourished. No distress.  HENT:  Head: Normocephalic.  Eyes: Conjunctivae are normal.  Neck: Neck supple.  Cardiovascular: Normal rate, regular rhythm and normal heart sounds.   Pulmonary/Chest: Effort normal and breath sounds normal. No respiratory distress. She has no wheezes. She has no rales.  Abdominal: Soft. Bowel sounds are normal. She exhibits no distension. There is tenderness. There is no rebound.  Suprapubic tenderness. No CVA tenderness bilaterally  Genitourinary:  Normal external genitalia. Normal vaginal canal. Scabbing to the cervix, yellowish brown. NO cervical discharge. Uterine tenderness. No CMT. No adnexal tenderness  Musculoskeletal: She exhibits no edema.  Neurological: She is alert.  Skin: Skin is warm and dry.  Psychiatric: She has a normal mood and affect. Her behavior is normal.    ED Course  Procedures (including critical care time) Labs Review Labs Reviewed  WET PREP, GENITAL - Abnormal; Notable for the following:    Yeast Wet Prep HPF POC RARE (*)    WBC, Wet Prep HPF POC RARE (*)    All other components within normal limits  COMPREHENSIVE  METABOLIC PANEL - Abnormal; Notable for the following:    Glucose, Bld 115 (*)    All other components within normal limits  GC/CHLAMYDIA PROBE AMP  CBC WITH DIFFERENTIAL  URINALYSIS, ROUTINE W REFLEX MICROSCOPIC  PREGNANCY, URINE    Imaging Review Dg Abd 2 Views  10/19/2013   CLINICAL DATA:  Abdominal pain and constipation  EXAM: ABDOMEN - 2 VIEW  COMPARISON:   None.  FINDINGS: Supine and upright images were obtained. There is diffuse stool throughout colon. Bowel gas pattern is unremarkable. No obstruction or free air. There are a few small phleboliths in the lower pelvis.  IMPRESSION: Diffuse stool throughout colon. Overall bowel gas pattern unremarkable.   Electronically Signed   By: Bretta BangWilliam  Woodruff M.D.   On: 10/19/2013 22:00     EKG Interpretation None      MDM   Final diagnoses:  Candida vaginitis  Pelvic pain in female    Patient suprapubic pain and tenderness. She did have biopsies colposcopy done today, and I suspect this is what's making her pain worsening. She is afebrile, nontoxic appearing, vital signs are normal, urinalysis is normal, blood tests are all unremarkable. She did admit to some constipation today obtained to the abdomen which showed moderate stool. Treated her with Toradol, treated yeast infection with Diflucan 150 mg. Discharge home with close outpatient followup. Naprosyn and Ultram for pain.   Filed Vitals:   10/19/13 1828 10/19/13 2235  BP: 111/64 115/67  Pulse: 75 72  Temp: 98.8 F (37.1 C) 98.7 F (37.1 C)  TempSrc: Oral Oral  Resp: 20 18  SpO2: 99% 99%     Myriam Jacobsonatyana A Jacorey Donaway, PA-C 10/20/13 0038

## 2013-10-20 ENCOUNTER — Telehealth: Payer: Self-pay | Admitting: Family Medicine

## 2013-10-20 DIAGNOSIS — R87619 Unspecified abnormal cytological findings in specimens from cervix uteri: Secondary | ICD-10-CM

## 2013-10-20 DIAGNOSIS — Z9889 Other specified postprocedural states: Secondary | ICD-10-CM

## 2013-10-20 DIAGNOSIS — R1030 Lower abdominal pain, unspecified: Secondary | ICD-10-CM

## 2013-10-20 DIAGNOSIS — G8929 Other chronic pain: Secondary | ICD-10-CM

## 2013-10-20 LAB — GC/CHLAMYDIA PROBE AMP
CT PROBE, AMP APTIMA: NEGATIVE
GC PROBE AMP APTIMA: NEGATIVE

## 2013-10-20 NOTE — Telephone Encounter (Signed)
I called and discussed cervical biopsy and ECC report with patient. Cervical biopsy showed CIN-1 lesion. ECC appears benign but specimen very limited and this is due to inability to obtain specimen secondary to cervical stenosis. This is concerning to me especially with PAP result suspicious for AGC. As discussed with patient, in addition to the chronic suprapubic pain she is having, I will refer her to gynecologist for assessment and further management. She verbalized understanding and agreed with plan.

## 2013-10-20 NOTE — ED Provider Notes (Signed)
Medical screening examination/treatment/procedure(s) were performed by non-physician practitioner and as supervising physician I was immediately available for consultation/collaboration.   EKG Interpretation None       Flint MelterElliott L Aidric Endicott, MD 10/20/13 1151

## 2013-10-20 NOTE — Telephone Encounter (Signed)
Message copied by Doreene ElandENIOLA, Sofhia Ulibarri T on Fri Oct 20, 2013  1:42 PM ------      Message from: Moses MannersHENSEL, WILLIAM A      Created: Fri Oct 20, 2013 12:45 PM                   ----- Message -----         From: Lab in [facility] Interface         Sent: 10/20/2013  12:42 PM           To: Sanjuana LettersWilliam Arthur Hensel, MD             ------

## 2013-11-15 ENCOUNTER — Encounter: Payer: Self-pay | Admitting: Obstetrics & Gynecology

## 2013-11-15 NOTE — Progress Notes (Unsigned)
Opened in error

## 2013-12-14 ENCOUNTER — Encounter: Payer: Self-pay | Admitting: Family Medicine

## 2013-12-14 ENCOUNTER — Other Ambulatory Visit (HOSPITAL_COMMUNITY)
Admission: RE | Admit: 2013-12-14 | Discharge: 2013-12-14 | Disposition: A | Discharge status: Home / Self Care | Payer: Medicaid Other | Source: Ambulatory Visit | Attending: Family Medicine | Admitting: Family Medicine

## 2013-12-14 ENCOUNTER — Ambulatory Visit (INDEPENDENT_AMBULATORY_CARE_PROVIDER_SITE_OTHER): Payer: Medicaid Other | Admitting: Family Medicine

## 2013-12-14 VITALS — BP 118/70 | HR 78 | Temp 97.8°F | Ht <= 58 in | Wt 141.8 lb

## 2013-12-14 DIAGNOSIS — R87629 Unspecified abnormal cytological findings in specimens from vagina: Secondary | ICD-10-CM

## 2013-12-14 DIAGNOSIS — Z124 Encounter for screening for malignant neoplasm of cervix: Secondary | ICD-10-CM | POA: Diagnosis not present

## 2013-12-14 DIAGNOSIS — Z01818 Encounter for other preprocedural examination: Secondary | ICD-10-CM

## 2013-12-14 DIAGNOSIS — R87619 Unspecified abnormal cytological findings in specimens from cervix uteri: Secondary | ICD-10-CM | POA: Diagnosis present

## 2013-12-14 DIAGNOSIS — Z3202 Encounter for pregnancy test, result negative: Secondary | ICD-10-CM

## 2013-12-14 LAB — POCT PREGNANCY, URINE: Preg Test, Ur: NEGATIVE

## 2013-12-14 NOTE — Progress Notes (Signed)
   Subjective:    Patient ID: Frances Hall, female    DOB: 12-Mar-1981, 33 y.o.   MRN: [id number]  HPI Patient seen for AGC on PAP done 7/17.  Additionally, had ASC-H on same PAP and had colposcopy revealing CIN1.  Around the time of her PAP, she did have abnormal periods and had bleeding throughout the month.  She currently has normal periods now without intermenstrual bleeding.  She is not on birth control.     Review of Systems  Constitutional: Negative for fever, chills and fatigue.  Respiratory: Negative for shortness of breath, wheezing and stridor.   Cardiovascular: Negative for chest pain, palpitations and leg swelling.  Gastrointestinal: Positive for constipation (occasional). Negative for nausea, vomiting, abdominal pain, diarrhea and abdominal distention.  Genitourinary: Negative for hematuria, vaginal bleeding, vaginal discharge and vaginal pain.  Neurological: Negative for light-headedness, numbness and headaches.  Psychiatric/Behavioral: The patient is not nervous/anxious.        Objective:   Physical Exam  Constitutional: She appears well-developed and well-nourished.  Pulmonary/Chest: Effort normal and breath sounds normal.  Abdominal: Soft. She exhibits no mass. There is no tenderness. There is no rebound and no guarding.  Genitourinary: Rectal exam shows external hemorrhoid. There is no rash, tenderness, lesion or injury on the right labia. There is no rash, tenderness, lesion or injury on the left labia. Cervix exhibits no discharge and no friability. No erythema, tenderness or bleeding around the vagina. No foreign body around the vagina. No signs of injury around the vagina. No vaginal discharge found.       Assessment & Plan:  1.  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 Discussed with the patient regarding the nature of the abnormality on her PAP smear, possible locations.  Discussed risks of endometrial cancer with the patient and that best way to rule out endometrial  cancer would be through endometrial biopsy.  Risks discussed.  Procedure documented below.  Care following procedure discussed with patient and handout given.  Will call patients with results.     ENDOMETRIAL BIOPSY     The indications for endometrial biopsy were reviewed.   Risks of the biopsy including cramping, bleeding, infection, uterine perforation, inadequate specimen and need for additional procedures  were discussed. The patient states she understands and agrees to undergo procedure today. Consent was signed. Time out was performed. Urine HCG was negative. A sterile speculum was placed in the patient's vagina and the cervix was prepped with Betadine. The 3 mm pipelle was introduced into the endometrial cavity without difficulty to a depth of 7 cm, and a moderate amount of tissue was obtained and sent to pathology. The instruments were removed from the patient's vagina. Minimal bleeding from the cervix was noted. The patient tolerated the procedure well. Routine post-procedure instructions were given to the patient. The patient will follow up to review the results and for further management.

## 2013-12-14 NOTE — Patient Instructions (Signed)
Endometrial Biopsy, Care After Refer to this sheet in the next few weeks. These instructions provide you with information on caring for yourself after your procedure. Your health care provider may also give you more specific instructions. Your treatment has been planned according to current medical practices, but problems sometimes occur. Call your health care provider if you have any problems or questions after your procedure. WHAT TO EXPECT AFTER THE PROCEDURE After your procedure, it is typical to have the following:  You may have mild cramping and a small amount of vaginal bleeding for a few days after the procedure. This is normal. HOME CARE INSTRUCTIONS  Only take over-the-counter or prescription medicine as directed by your health care provider.  Do not douche, use tampons, or have sexual intercourse until your health care provider approves.  Follow your health care provider's instructions regarding any activity restrictions, such as strenuous exercise or heavy lifting. SEEK MEDICAL CARE IF:  You have heavy bleeding or bleeding longer than 2 days after the procedure.  You have bad smelling drainage from your vagina.  You have a fever and chills.  Youhave severe lower stomach (abdominal) pain. SEEK IMMEDIATE MEDICAL CARE IF:  You have severe cramps in your stomach or back.  You pass large blood clots.  Your bleeding increases.  You become weak or lightheaded, or you pass out. Document Released: 12/28/2012 Document Reviewed: 12/28/2012 ExitCare Patient Information 2015 Exit[employer], [employer]. This information is not intended to replace advice given to you by your health care provider. Make sure you discuss any questions you have with your health care provider.  

## 2013-12-18 NOTE — Progress Notes (Signed)
Quick Note:  Endometrial biopsy results are normal - no abnormal cells seen on biopsy. Please inform patient. ______

## 2013-12-19 ENCOUNTER — Telehealth: Payer: Self-pay | Admitting: *Deleted

## 2013-12-19 ENCOUNTER — Telehealth: Payer: Self-pay

## 2013-12-19 NOTE — Telephone Encounter (Signed)
Message copied by Louanna RawAMPBELL, Houda Brau M on Tue Dec 19, 2013  4:52 PM ------      Message from: Levie HeritageSTINSON, JACOB J      Created: Mon Dec 18, 2013  5:36 PM       Endometrial biopsy results are normal - no abnormal cells seen on biopsy.  Please inform patient. ------

## 2013-12-19 NOTE — Telephone Encounter (Signed)
Requesting results of recent procedure. Knox RoyaltyErin Odell

## 2013-12-19 NOTE — Telephone Encounter (Signed)
Called patient and informed of normal endometrial biopsy results. Patient verbalized understanding and gratitude. No questions or concerns.

## 2013-12-19 NOTE — Telephone Encounter (Signed)
Will forward to MD. Camdyn Beske,CMA  

## 2013-12-19 NOTE — Telephone Encounter (Signed)
Needs to call Dr. Denyce RobertStinson's office. But looks like everything ok  " Endometrial biopsy results are normal - no abnormal cells seen on biopsy. Please inform patient."  ______      Floyd [facility] CenterMCM, MD

## 2013-12-20 NOTE — Telephone Encounter (Signed)
Pt is aware of results and voiced understanding to contact Dr. Adrian BlackwaterStinson. Jazmin Hartsell,CMA

## 2013-12-27 ENCOUNTER — Other Ambulatory Visit: Payer: Medicaid Other | Admitting: Obstetrics & Gynecology

## 2014-01-22 ENCOUNTER — Encounter: Payer: Self-pay | Admitting: Family Medicine

## 2014-03-09 ENCOUNTER — Encounter: Payer: Self-pay | Admitting: *Deleted

## 2015-04-23 IMAGING — CR DG ABDOMEN 2V
2 series · 2 of 2 positions shown · non-contrast
Comparison: None.

CLINICAL DATA: Abdominal pain and constipation

EXAM:
ABDOMEN - 2 VIEW

[w abdomen upright]
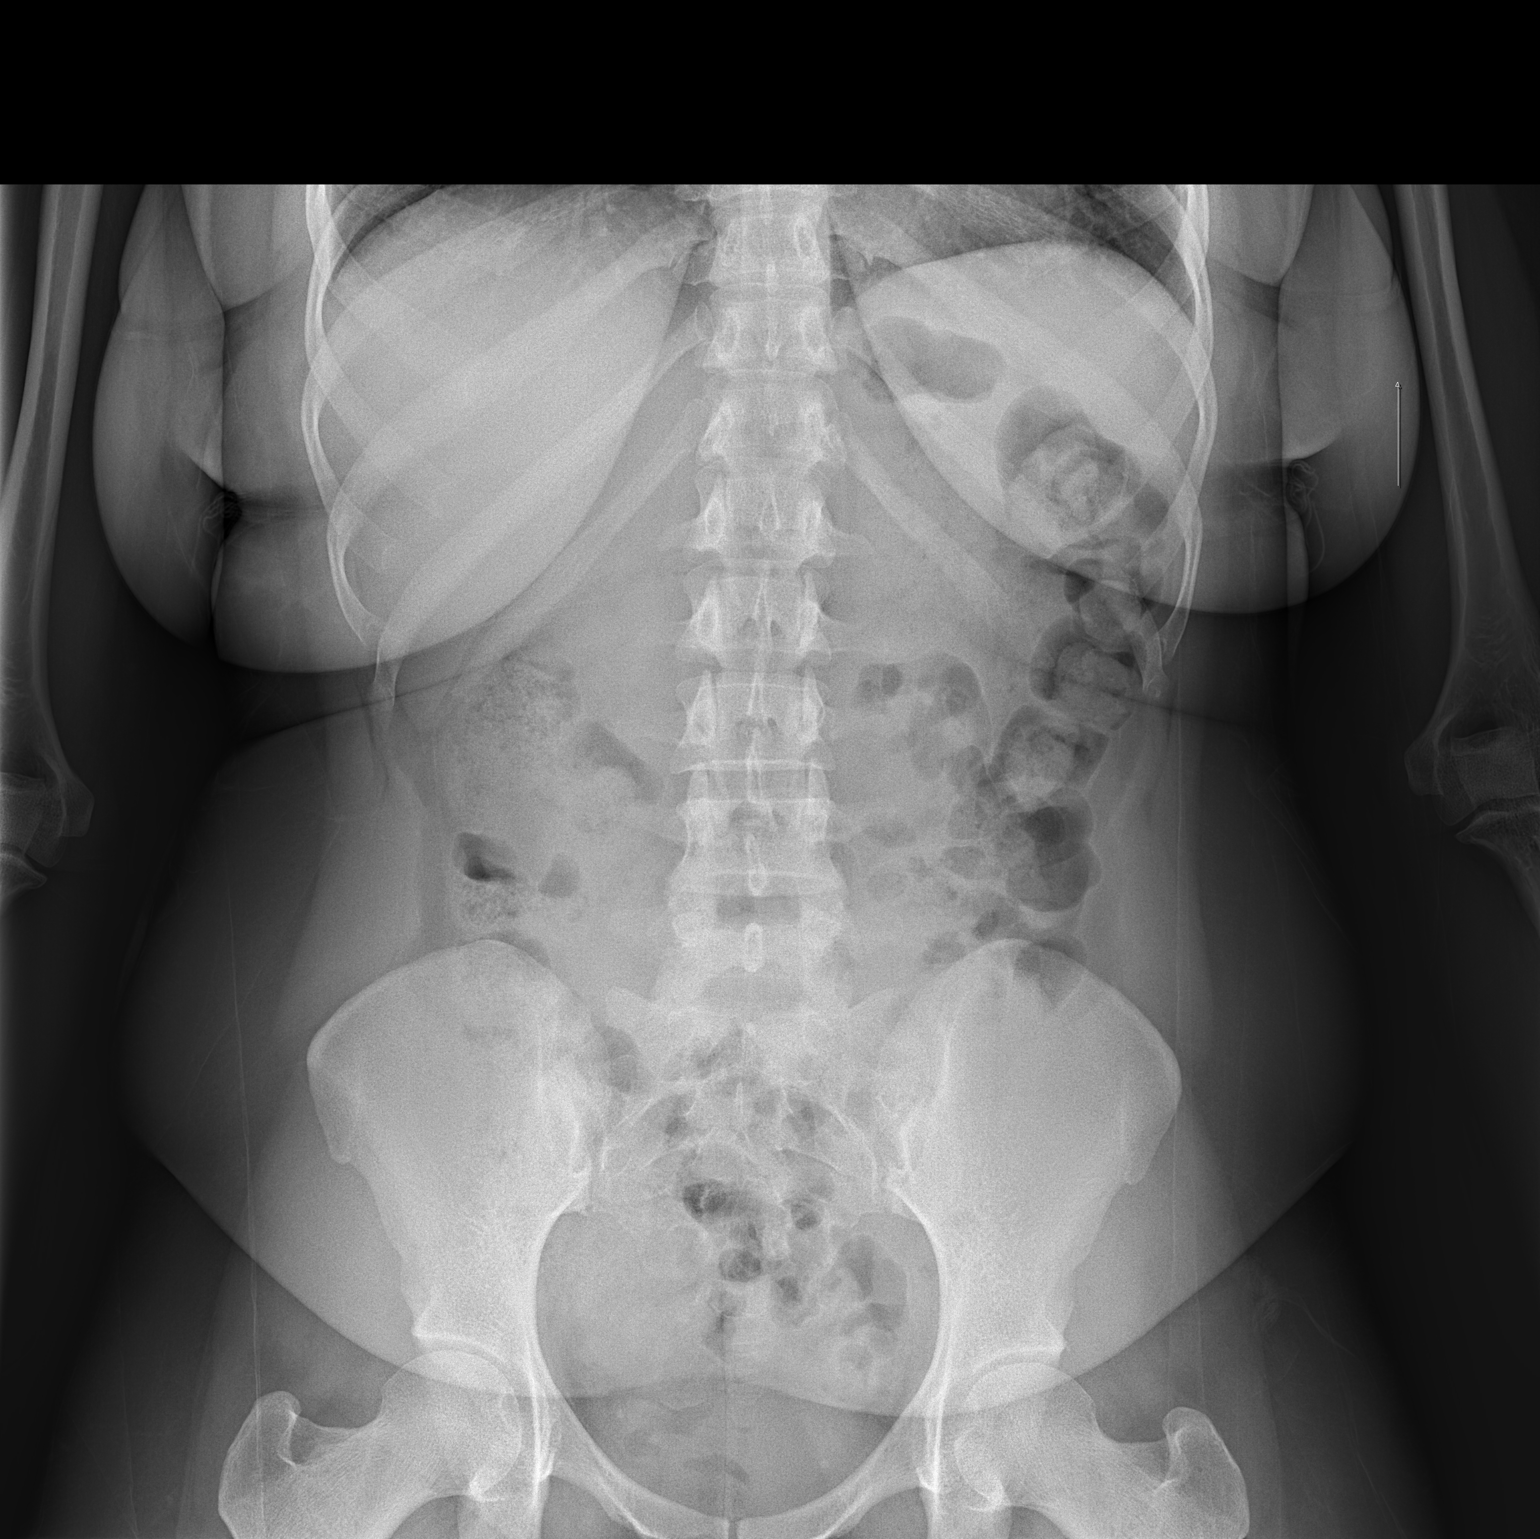

[t abdomen supine]
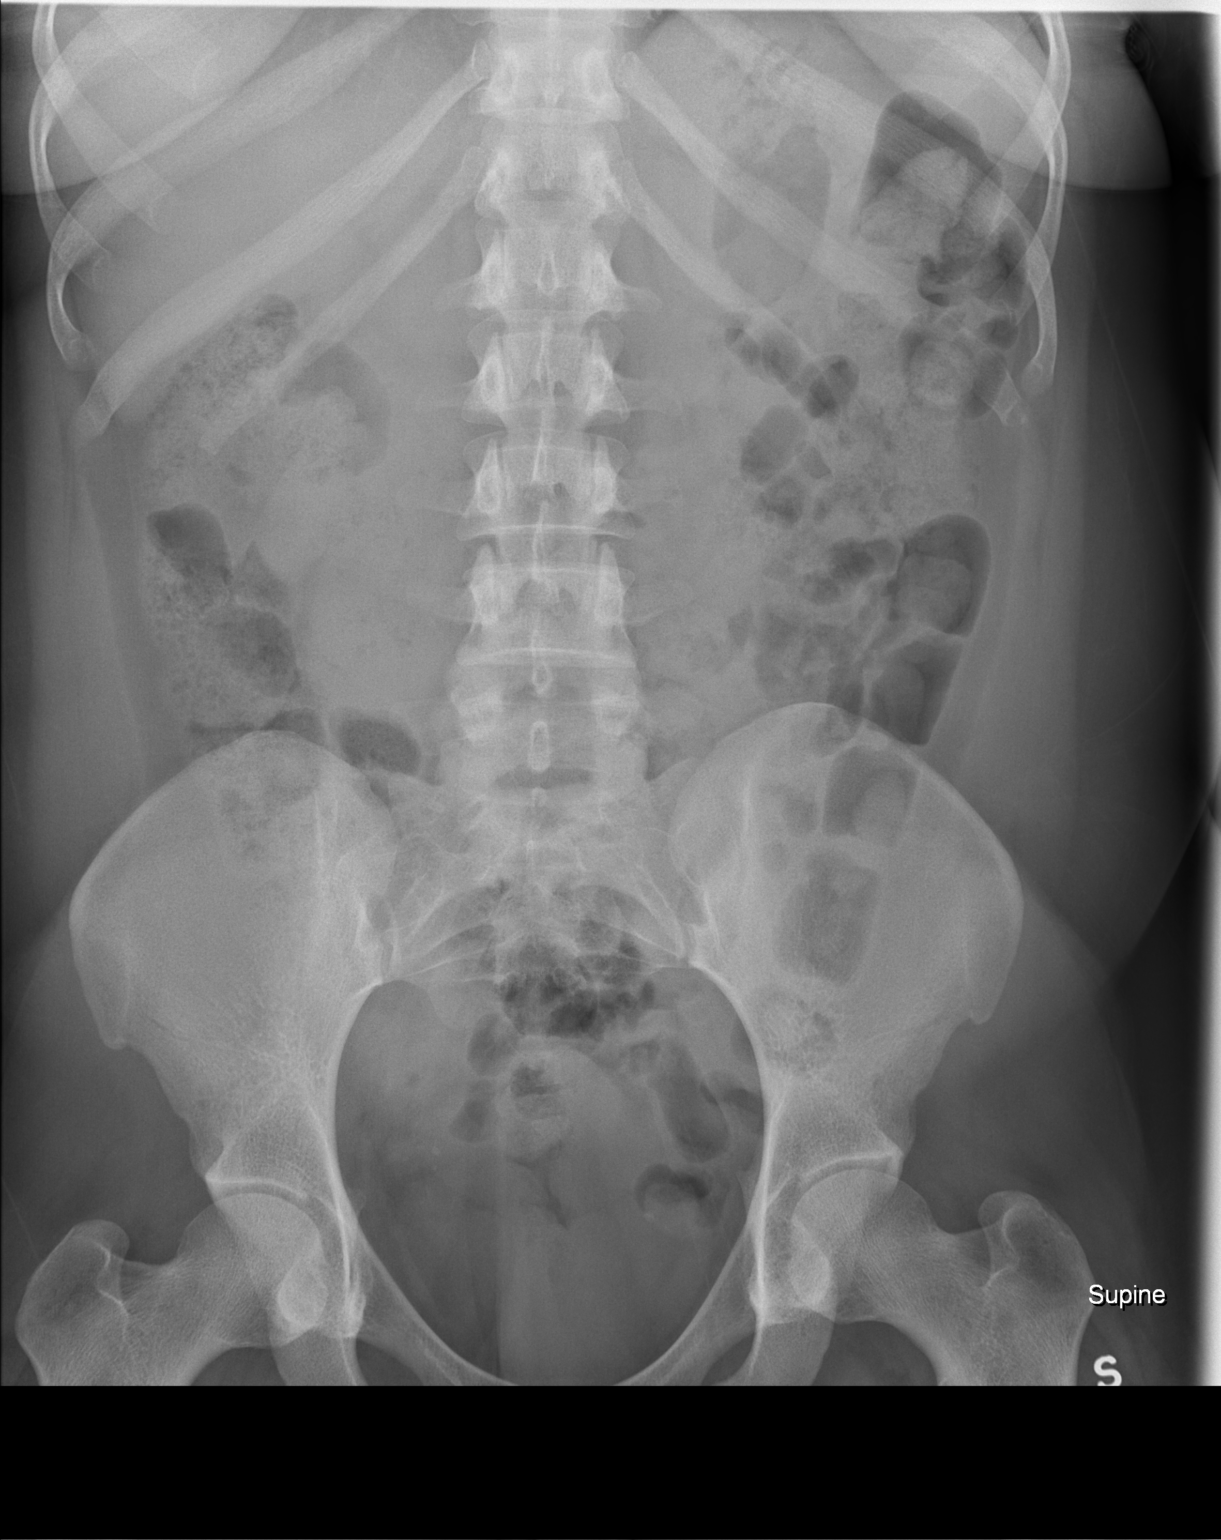

[2 of 2 positions shown; findings below may reference images not displayed]

FINDINGS: Supine and upright images were obtained. There is diffuse stool
throughout colon. Bowel gas pattern is unremarkable. No obstruction
or free air. There are a few small phleboliths in the lower pelvis.
IMPRESSION: Diffuse stool throughout colon. Overall bowel gas pattern
unremarkable.

## 2015-08-30 ENCOUNTER — Emergency Department (HOSPITAL_COMMUNITY): Payer: No Typology Code available for payment source

## 2015-08-30 ENCOUNTER — Emergency Department (HOSPITAL_COMMUNITY)
Admission: EM | Admit: 2015-08-30 | Discharge: 2015-08-30 | Disposition: A | Discharge status: Home / Self Care | Payer: No Typology Code available for payment source | Attending: Emergency Medicine | Admitting: Emergency Medicine

## 2015-08-30 ENCOUNTER — Encounter (HOSPITAL_COMMUNITY): Payer: Self-pay | Admitting: Emergency Medicine

## 2015-08-30 DIAGNOSIS — S8392XA Sprain of unspecified site of left knee, initial encounter: Secondary | ICD-10-CM | POA: Diagnosis not present

## 2015-08-30 DIAGNOSIS — Y939 Activity, unspecified: Secondary | ICD-10-CM | POA: Insufficient documentation

## 2015-08-30 DIAGNOSIS — Y999 Unspecified external cause status: Secondary | ICD-10-CM | POA: Insufficient documentation

## 2015-08-30 DIAGNOSIS — S8992XA Unspecified injury of left lower leg, initial encounter: Secondary | ICD-10-CM | POA: Diagnosis present

## 2015-08-30 DIAGNOSIS — Z79899 Other long term (current) drug therapy: Secondary | ICD-10-CM | POA: Diagnosis not present

## 2015-08-30 DIAGNOSIS — Y929 Unspecified place or not applicable: Secondary | ICD-10-CM | POA: Diagnosis not present

## 2015-08-30 MED ORDER — NAPROXEN 375 MG PO TABS: 375.0000 mg | ORAL_TABLET | Freq: Two times a day (BID) | ORAL | Status: DC

## 2015-08-30 MED ORDER — CYCLOBENZAPRINE HCL 5 MG PO TABS: 5.0000 mg | ORAL_TABLET | Freq: Three times a day (TID) | ORAL | Status: DC | PRN

## 2015-08-30 NOTE — ED Notes (Signed)
Pt denies concerns with dc 
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
## 2015-08-30 NOTE — Discharge Instructions (Signed)
Do not take the muscle relaxant if driving as it will make you sleepy.  °
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
## 2015-09-06 ENCOUNTER — Ambulatory Visit (INDEPENDENT_AMBULATORY_CARE_PROVIDER_SITE_OTHER): Payer: Medicaid Other | Admitting: *Deleted

## 2015-09-06 DIAGNOSIS — Z111 Encounter for screening for respiratory tuberculosis: Secondary | ICD-10-CM

## 2015-09-06 NOTE — Progress Notes (Signed)
   PPD placed Left Forearm.  Pt to return 09/09/2015 for reading.  Pt tolerated intradermal injection. Clovis PuMartin, Tamika L, RN

## 2015-09-09 ENCOUNTER — Ambulatory Visit: Payer: Medicaid Other | Admitting: *Deleted

## 2015-09-09 ENCOUNTER — Encounter: Payer: Self-pay | Admitting: *Deleted

## 2015-09-09 DIAGNOSIS — Z111 Encounter for screening for respiratory tuberculosis: Secondary | ICD-10-CM

## 2015-09-09 LAB — TB SKIN TEST
Induration: 0 mm
TB Skin Test: NEGATIVE

## 2015-09-09 NOTE — Progress Notes (Signed)
   PPD Reading Note PPD read and results entered in EpicCare. Result: 0 mm induration. Interpretation: Negative If test not read within 48-72 hours of initial placement, patient advised to repeat in other arm 1-3 weeks after this test. Allergic reaction: no  Gustaf Mccarter L, RN  

## 2015-09-23 ENCOUNTER — Encounter: Payer: Medicaid Other | Admitting: Family Medicine

## 2015-11-11 ENCOUNTER — Ambulatory Visit (INDEPENDENT_AMBULATORY_CARE_PROVIDER_SITE_OTHER): Payer: Medicaid Other | Admitting: Obstetrics and Gynecology

## 2015-11-11 ENCOUNTER — Encounter: Payer: Self-pay | Admitting: Obstetrics and Gynecology

## 2015-11-11 VITALS — BP 112/80 | HR 77 | Temp 98.3°F | Ht <= 58 in | Wt 159.0 lb

## 2015-11-11 DIAGNOSIS — R5383 Other fatigue: Secondary | ICD-10-CM | POA: Diagnosis not present

## 2015-11-11 DIAGNOSIS — R1030 Lower abdominal pain, unspecified: Secondary | ICD-10-CM | POA: Diagnosis present

## 2015-11-11 LAB — CBC WITH DIFFERENTIAL/PLATELET
Basophils Absolute: 0 cells/uL (ref 0–200)
Basophils Relative: 0 %
EOS PCT: 4 %
Eosinophils Absolute: 252 cells/uL (ref 15–500)
HCT: 38.8 % (ref 35.0–45.0)
HEMOGLOBIN: 12.8 g/dL (ref 11.7–15.5)
Lymphocytes Relative: 26 %
Lymphs Abs: 1638 cells/uL (ref 850–3900)
MCH: 29.8 pg (ref 27.0–33.0)
MCHC: 33 g/dL (ref 32.0–36.0)
MCV: 90.2 fL (ref 80.0–100.0)
MPV: 9.7 fL (ref 7.5–12.5)
Monocytes Absolute: 441 cells/uL (ref 200–950)
Monocytes Relative: 7 %
NEUTROS ABS: 3969 {cells}/uL (ref 1500–7800)
NEUTROS PCT: 63 %
PLATELETS: 289 10*3/uL (ref 140–400)
RBC: 4.3 MIL/uL (ref 3.80–5.10)
RDW: 13.8 % (ref 11.0–15.0)
WBC: 6.3 10*3/uL (ref 3.8–10.8)

## 2015-11-11 LAB — POCT URINALYSIS DIPSTICK
BILIRUBIN UA: NEGATIVE
Glucose, UA: NEGATIVE
KETONES UA: NEGATIVE
Leukocytes, UA: NEGATIVE
Nitrite, UA: NEGATIVE
PH UA: 5.5
PROTEIN UA: NEGATIVE
RBC UA: NEGATIVE
Spec Grav, UA: >=1.03
Urobilinogen, UA: 1

## 2015-11-11 LAB — TSH: TSH: 1.8 m[IU]/L

## 2015-11-11 LAB — POCT URINE PREGNANCY: Preg Test, Ur: NEGATIVE

## 2015-11-11 NOTE — Patient Instructions (Signed)
Fatigue  Fatigue is feeling tired all of the time, a lack of energy, or a lack of motivation. Occasional or mild fatigue is often a normal response to activity or life in general. However, long-lasting (chronic) or extreme fatigue may indicate an underlying medical condition.  HOME CARE INSTRUCTIONS   Watch your fatigue for any changes. The following actions may help to lessen any discomfort you are feeling:  · Talk to your health care provider about how much sleep you need each night. Try to get the required amount every night.  · Take medicines only as directed by your health care provider.  · Eat a healthy and nutritious diet. Ask your health care provider if you need help changing your diet.  · Drink enough fluid to keep your urine clear or pale yellow.  · Practice ways of relaxing, such as yoga, meditation, massage therapy, or acupuncture.  · Exercise regularly.    · Change situations that cause you stress. Try to keep your work and personal routine reasonable.  · Do not abuse illegal drugs.  · Limit alcohol intake to no more than 1 drink per day for nonpregnant women and 2 drinks per day for men. One drink equals 12 ounces of beer, 5 ounces of wine, or 1½ ounces of hard liquor.  · Take a multivitamin, if directed by your health care provider.  SEEK MEDICAL CARE IF:   · Your fatigue does not get better.  · You have a fever.    · You have unintentional weight loss or gain.  · You have headaches.    · You have difficulty:      Falling asleep.    Sleeping throughout the night.  · You feel angry, guilty, anxious, or sad.     · You are unable to have a bowel movement (constipation).    · You skin is dry.     · Your legs or another part of your body is swollen.    SEEK IMMEDIATE MEDICAL CARE IF:   · You feel confused.    · Your vision is blurry.  · You feel faint or pass out.    · You have a severe headache.    · You have severe abdominal, pelvic, or back pain.    · You have chest pain, shortness of breath, or an  irregular or fast heartbeat.    · You are unable to urinate or you urinate less than normal.    · You develop abnormal bleeding, such as bleeding from the rectum, vagina, nose, lungs, or nipples.  · You vomit blood.     · You have thoughts about harming yourself or committing suicide.    · You are worried that you might harm someone else.       This information is not intended to replace advice given to you by your health care provider. Make sure you discuss any questions you have with your health care provider.     Document Released: 01/04/2007 Document Revised: 03/30/2014 Document Reviewed: 07/11/2013  Elsevier Interactive Patient Education ©2016 [employer]

## 2015-11-11 NOTE — Progress Notes (Signed)
Patient ID: Frances LacksRenee M Hall, female   DOB: 08/19/1980, 35 y.o.   MRN: [id number]   History of present illness:  Patient is a 35 year old female with no significant past medical history who presents with 2 weeks of worsening fatigue. She reports that she was so tired the other day that she had to pull off the road as she was afraid she was given a fall asleep. She reports even at home she'll lay down and fall asleep in the in instant this is new to her. She reports no significant change in her activity she may be working a few more hours than usual but nothing extreme. She denies any increased stress in her life. She does report regular menstrual periods with last period the beginning of this month and it was not particularly heavy or long. She does report she's had some urinary frequency but no urgency or dysuria. She's had some lower crampy abdominal pain which she is unsure if it is due to her physically strenuous work or something else. She denies constipation or diarrhea  I did personally review patient's past medical history and medications as well as social history.  Review of Systems  Constitutional: Negative for chills, fever and weight loss.  HENT: Negative for congestion.   Eyes: Negative for blurred vision and double vision.  Respiratory: Negative for cough and shortness of breath.   Cardiovascular: Negative for chest pain, palpitations and orthopnea.  Gastrointestinal: Positive for abdominal pain. Negative for constipation, diarrhea, heartburn, nausea and vomiting.  Genitourinary: Negative for dysuria and urgency.  Musculoskeletal: Negative for back pain and myalgias.  Skin: Negative for itching and rash.  Neurological: Negative for dizziness, sensory change and headaches.  Endo/Heme/Allergies: Negative for environmental allergies. Does not bruise/bleed easily.  Psychiatric/Behavioral: Negative for depression.    Physical exam: Vital signs were reviewed as documented Vitals:   11/11/15 1604  Weight: 159 lb (72.1 kg)  Height: 4\' 9"  (1.448 m)    Physical Exam  Constitutional: She is oriented to person, place, and time and well-developed, well-nourished, and in no distress. No distress.  HENT:  Head: Normocephalic and atraumatic.  Right Ear: External ear normal.  Left Ear: External ear normal.  Mouth/Throat: No oropharyngeal exudate.  Left TM right TM Gray mobile  Eyes: Conjunctivae and EOM are normal. Pupils are equal, round, and reactive to light.  Neck: Normal range of motion. Neck supple.  No significant cervical lymphadenopathy  Cardiovascular: Normal rate, regular rhythm and normal heart sounds.   Pulmonary/Chest: Effort normal and breath sounds normal. No respiratory distress. She has no wheezes. She has no rales.  Abdominal: Soft. Bowel sounds are normal. She exhibits no distension.  Minimal suprapubic tenderness  Musculoskeletal: Normal range of motion. She exhibits deformity. She exhibits no edema.  Neurological: She is alert and oriented to person, place, and time.  Skin: Skin is warm and dry. She is not diaphoretic.  Psychiatric: Mood, affect and judgment normal.   Assessment/Plan Fatigue: Patient reports his fatigue is out of the ordinary as well as a blood pressure of 112/80 being out of the ordinary for her. She does report some decreased fluid intake. Likely this is not all caused by dehydration but did encourage increased by mouth intake.  Additionally discussed with patient that this severe fatigue could be signs of something more serious. CBC and TSH ordered.   Lower abdominal pain: Patient with lower abdominal pain acute onset ER as before arrival. Comes and goes not severe. Some increased frequency will check  a urinalysis as well as a pregnancy test.

## 2015-11-14 ENCOUNTER — Telehealth: Payer: Self-pay | Admitting: Obstetrics and Gynecology

## 2015-11-14 NOTE — Telephone Encounter (Signed)
Spoke with pt. She reports symptoms have improved. She was glad all her labs were wnl. No other concerns at this time. Informed she can follow up if symptoms worsen.

## 2016-02-03 ENCOUNTER — Ambulatory Visit (INDEPENDENT_AMBULATORY_CARE_PROVIDER_SITE_OTHER): Payer: Self-pay | Admitting: Family Medicine

## 2016-02-03 ENCOUNTER — Encounter: Payer: Self-pay | Admitting: Family Medicine

## 2016-02-03 VITALS — BP 128/71 | HR 74 | Temp 97.9°F | Wt 156.0 lb

## 2016-02-03 DIAGNOSIS — L732 Hidradenitis suppurativa: Secondary | ICD-10-CM

## 2016-02-03 MED ORDER — CLINDAMYCIN PHOSPHATE 1 % EX SOLN: Freq: Two times a day (BID) | CUTANEOUS | 1 refills | Status: DC

## 2016-02-03 NOTE — Progress Notes (Signed)
   HPI  CC: Boil under left armpit  Frances Hall is a 35 y.o. , with the above CC.  Present for 2 weeks, new area started 1-2 days ago. Location: left axillary. No redness. Mildly painful. No drainage. No fevers/chills. Taking OTC Motrin/Tylenol for pain. Denies N/V/D. No history of MRSA. No other areas on body. Not spreading but does have a new area after other boil went away.  The following portions of the patient's history were reviewed and updated as appropriate: allergies, current medications, past family history, past medical history, past social history, past surgical history and problem list.  ROS: A 12-point review of systems was performed and negative, except as stated in the above HPI.  OBJECTIVE BP 128/71   Pulse 74   Temp 97.9 F (36.6 C) (Oral)   Wt 156 lb (70.8 kg)   LMP 01/10/2016   BMI 33.76 kg/m  Gen: NAD, alert, cooperative, and pleasant. HEENT: NCAT, EOMI, PERRL CV: RRR, no murmur Resp: CTAB, no wheezes, non-labored Abd: SNTND, BS present, no guarding or organomegaly Ext: No edema, warm Neuro: Alert and oriented, Speech clear, No gross deficits Derm: 1cm round sebaceous cyst/hidradenitis, non-erythematous, not come to head on left axillary region, not tracking, tender to touch  ASSESSMENT AND PLAN: 1. Hidradenitis suppurativa of left axilla - Return to office if worsens and wants I&D performed - clindamycin (CLEOCIN-T) 1 % external solution; Apply topically 2 (two) times daily.  Dispense: 30 mL; Refill: 1    Cleda ClarksElizabeth W Mora Pedraza, DO [facility] 02/03/2016 10:22 AM

## 2016-02-03 NOTE — Patient Instructions (Signed)

## 2018-01-28 ENCOUNTER — Encounter (HOSPITAL_COMMUNITY): Payer: Self-pay | Admitting: *Deleted

## 2018-01-28 ENCOUNTER — Emergency Department (HOSPITAL_COMMUNITY): Payer: Self-pay

## 2018-01-28 ENCOUNTER — Emergency Department (HOSPITAL_COMMUNITY)
Admission: EM | Admit: 2018-01-28 | Discharge: 2018-01-28 | Disposition: A | Discharge status: Home / Self Care | Payer: Self-pay | Attending: Emergency Medicine | Admitting: Emergency Medicine

## 2018-01-28 DIAGNOSIS — R21 Rash and other nonspecific skin eruption: Secondary | ICD-10-CM | POA: Insufficient documentation

## 2018-01-28 DIAGNOSIS — R0600 Dyspnea, unspecified: Secondary | ICD-10-CM | POA: Insufficient documentation

## 2018-01-28 DIAGNOSIS — R071 Chest pain on breathing: Secondary | ICD-10-CM

## 2018-01-28 LAB — COMPREHENSIVE METABOLIC PANEL
ALT: 16 U/L (ref 0–44)
ANION GAP: 5 (ref 5–15)
AST: 16 U/L (ref 15–41)
Albumin: 3.9 g/dL (ref 3.5–5.0)
Alkaline Phosphatase: 53 U/L (ref 38–126)
BUN: 8 mg/dL (ref 6–20)
CHLORIDE: 106 mmol/L (ref 98–111)
CO2: 27 mmol/L (ref 22–32)
CREATININE: 0.84 mg/dL (ref 0.44–1.00)
Calcium: 9 mg/dL (ref 8.9–10.3)
Glucose, Bld: 103 mg/dL — ABNORMAL HIGH (ref 70–99)
POTASSIUM: 3.7 mmol/L (ref 3.5–5.1)
SODIUM: 138 mmol/L (ref 135–145)
Total Bilirubin: 1 mg/dL (ref 0.3–1.2)
Total Protein: 6.8 g/dL (ref 6.5–8.1)

## 2018-01-28 LAB — CBC WITH DIFFERENTIAL/PLATELET
ABS IMMATURE GRANULOCYTES: 0.01 10*3/uL (ref 0.00–0.07)
BASOS PCT: 0 %
Basophils Absolute: 0 10*3/uL (ref 0.0–0.1)
EOS ABS: 0.2 10*3/uL (ref 0.0–0.5)
Eosinophils Relative: 4 %
HCT: 42.3 % (ref 36.0–46.0)
Hemoglobin: 13.6 g/dL (ref 12.0–15.0)
Immature Granulocytes: 0 %
Lymphocytes Relative: 16 %
Lymphs Abs: 1 10*3/uL (ref 0.7–4.0)
MCH: 30 pg (ref 26.0–34.0)
MCHC: 32.2 g/dL (ref 30.0–36.0)
MCV: 93.4 fL (ref 80.0–100.0)
MONO ABS: 0.4 10*3/uL (ref 0.1–1.0)
Monocytes Relative: 6 %
NEUTROS ABS: 4.7 10*3/uL (ref 1.7–7.7)
NEUTROS PCT: 74 %
PLATELETS: 270 10*3/uL (ref 150–400)
RBC: 4.53 MIL/uL (ref 3.87–5.11)
RDW: 13.4 % (ref 11.5–15.5)
WBC: 6.3 10*3/uL (ref 4.0–10.5)
nRBC: 0 % (ref 0.0–0.2)

## 2018-01-28 LAB — I-STAT TROPONIN, ED: TROPONIN I, POC: 0 ng/mL (ref 0.00–0.08)

## 2018-01-28 LAB — I-STAT BETA HCG BLOOD, ED (MC, WL, AP ONLY): I-stat hCG, quantitative: <5 m[IU]/mL (ref <5)

## 2018-01-28 LAB — D-DIMER, QUANTITATIVE (NOT AT ARMC): D DIMER QUANT: 0.49 ug{FEU}/mL (ref 0.00–0.50)

## 2018-01-28 MED ORDER — TRIAMCINOLONE ACETONIDE 0.1 % EX CREA: 1.0000 "application " | TOPICAL_CREAM | Freq: Two times a day (BID) | CUTANEOUS | 0 refills | Status: DC

## 2018-01-28 NOTE — ED Triage Notes (Signed)
Pt in c/o rash that started about a week ago, rash is generalized and itches, has been applying cortisone cream which has been helping, also developed a pain in her upper chest when inhaling, denies cough

## 2018-01-28 NOTE — ED Notes (Signed)
Patient given discharge instructions and verbalized understanding.  Patient stable to discharge at this time.  Patient is alert and oriented to baseline.  No distressed noted at this time.  All belongings taken with the patient at discharge.   

## 2018-01-28 NOTE — ED Notes (Signed)
Pt transported to xray 

## 2018-01-28 NOTE — Discharge Instructions (Signed)
Please use the steroid cream as directed on your discharge paperwork.  Please follow up with your primary care provider within 5-7 days for re-evaluation of your symptoms. If you do not have a primary care provider, information for a healthcare clinic has been provided for you to make arrangements for follow up care. Please return to the emergency department for any new or worsening symptoms.

## 2018-01-28 NOTE — ED Provider Notes (Signed)
[facility] EMERGENCY DEPARTMENT Provider Note   CSN: [id number] Arrival date & time: 01/28/18  1131     History   Chief Complaint Chief Complaint  Patient presents with  . Cough  . Rash    HPI Frances Hall is a 37 y.o. female.  HPI   Patient is a 37 year old female who presents emergency department today for evaluation of multiple complaints.  Patient states she has had a rash to her body for the last week.  Started out as a patch on her right leg.  She later developed an itchy rash to her bilateral arms legs abdomen and back.  She has tried using hydrocortisone over-the-counter which has improved her symptoms somewhat but has not resolved them.  No fevers or chills.  No body aches.  No headaches.  Patient also reports that she has been having some pain when she breathes in.  This has been ongoing for the last several days.  Initially she just had pain with deep inspiration but now she has pain with more shallow inspiration.  Denies cough, rhinorrhea, congestion or other URI symptoms.  Denies shortness of breath at rest but states she has been getting more fatigued with walking.  She attributed this to being on her menses as she states she has felt this way in the past on her menses.  Denies a history of anemia.  No lower extremity swelling/pain.  Denies hemoptysis, recent surgery/trauma, recent long travel, hormone use, personal hx of cancer, or hx of DVT/PE.   History reviewed. No pertinent past medical history.  Patient Active Problem List   Diagnosis Date Noted  . 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 Menorrhagia 10/06/2013  . Other malaise and fatigue 08/31/2013  . Healthcare maintenance 08/31/2013    Past Surgical History:  Procedure Laterality Date  . CERVICAL BIOPSY  W/ LOOP ELECTRODE EXCISION Bilateral      OB History    Gravida  2   Para  2   Term  2   Preterm  0   AB  0   Living  2     SAB  0   TAB  0   Ectopic  0   Multiple  0   Live Births               Home Medications    Prior to Admission medications   Medication Sig Start Date End Date Taking? Authorizing Provider  clindamycin (CLEOCIN-T) 1 % external solution Apply topically 2 (two) times daily. 02/03/16   Mumaw, Hiram Comber, DO  triamcinolone cream (KENALOG) 0.1 % Apply 1 application topically 2 (two) times daily. 453.6 01/28/18   Jannetta Massey S, PA-C    Family History Family History  Problem Relation Age of Onset  . Hypertension Mother   . Hypertension Sister   . Hypertension Maternal Grandmother   . Stroke Maternal Grandmother     Social History Social History   Tobacco Use  . Smoking status: Never Smoker  Substance Use Topics  . Alcohol use: No  . Drug use: No     Allergies   Patient has no known allergies.   Review of Systems Review of Systems  Constitutional: Negative for chills and fever.  HENT: Negative for congestion.        No lip or tongue swelling, no trouble swallowing or throat swelling  Eyes: Negative for visual disturbance.  Respiratory: Positive for shortness of breath. Negative for  cough and wheezing.   Cardiovascular: Negative for chest pain and leg swelling.  Gastrointestinal: Negative for abdominal pain, constipation, diarrhea, nausea and vomiting.  Genitourinary: Negative for flank pain.  Musculoskeletal: Negative for back pain and neck pain.  Skin: Positive for rash.  Neurological: Negative for dizziness, weakness, light-headedness, numbness and headaches.    Physical Exam Updated Vital Signs BP 119/84 (BP Location: Right Arm)   Pulse 93   Temp 97.9 F (36.6 C) (Oral)   Resp 18   SpO2 99%   Physical Exam  Constitutional: She is oriented to person, place, and time. She appears well-developed and well-nourished. No distress.  HENT:  Head: Normocephalic and atraumatic.  Eyes: Conjunctivae are normal.  Neck: Neck supple.    Cardiovascular: Normal rate, regular rhythm and normal heart sounds.  No murmur heard. Pulmonary/Chest: Effort normal and breath sounds normal. No stridor. No respiratory distress. She has no wheezes.  Abdominal: Soft. Bowel sounds are normal. There is no tenderness.  Musculoskeletal: She exhibits no edema.  No calf TTP, erythema, swelling.  Neurological: She is alert and oriented to person, place, and time.  Skin: Skin is warm and dry.  Erythematous patchy rash to bilat arms, abdomen and back that. No evidence of superinfection.  Psychiatric: She has a normal mood and affect.  Nursing note and vitals reviewed.         ED Treatments / Results  Labs (all labs ordered are listed, but only abnormal results are displayed) Labs Reviewed  COMPREHENSIVE METABOLIC PANEL - Abnormal; Notable for the following components:      Result Value   Glucose, Bld 103 (*)    All other components within normal limits  CBC WITH DIFFERENTIAL/PLATELET  D-DIMER, QUANTITATIVE (NOT AT [facility])  I-STAT BETA HCG BLOOD, ED ([facility], [facility], [facility] ONLY)  I-STAT TROPONIN, ED    EKG EKG Interpretation  Date/Time:  Friday January 28 2018 12:12:15 EST Ventricular Rate:  92 PR Interval:  156 QRS Duration: 80 QT Interval:  344 QTC Calculation: 425 R Axis:   45 Text Interpretation:  Normal sinus rhythm Possible Anterior infarct , age undetermined Abnormal ECG No STEMI.  Confirmed by Alona Bene [id number]) on 01/28/2018 12:23:13 PM   Radiology Dg Chest 2 View  Result Date: 01/28/2018 CLINICAL DATA:  Chest pain with inspiration. EXAM: CHEST - 2 VIEW COMPARISON:  None. FINDINGS: Cardiomediastinal silhouette is normal. Mediastinal contours appear intact. There is no evidence of focal airspace consolidation, pleural effusion or pneumothorax. Osseous structures are without acute abnormality. Soft tissues are grossly normal. IMPRESSION: No active cardiopulmonary disease. Electronically Signed   By: Ted Mcalpine M.D.    On: 01/28/2018 12:33    Procedures Procedures (including critical care time)  Medications Ordered in ED Medications - No data to display   Initial Impression / Assessment and Plan / ED Course  I have reviewed the triage vital signs and the nursing notes.  Pertinent labs & imaging results that were available during my care of the patient were reviewed by me and considered in my medical decision making (see chart for details).    Final Clinical Impressions(s) / ED Diagnoses   Final diagnoses:  Painful breathing  Rash   Patient presenting with multiple complaints.  Is complaining of a rash to the body for the last week.  Rashes itchy and erythematous.  No new soaps or medications.  Did switch up her detergent recently.  No fevers chills or body aches.  Is also complaining of pain when she breathes  in the middle of her chest.  Has been feeling more fatigued when walking but does not have chest pain.  No lower extremity swelling.  No risk factors for PE however initial heart rate was 106 therefore patient PERC positive.  D-dimer negative.  Remainder of labs of very reassuring.  Troponin is negative.  Normal electrolytes, kidney function, liver function and bilirubin.  CBC within normal limits.  EKG with normal sinus rhythm, possible age indeterminate anterior infarct.  Doubt ACS patient does not have chest pain.  Heart score is 1 (EKG).  Very low suspicion for heart failure in this patient with no signs of fluid overload and no cardiomegaly on chest x-ray.  No pneumothorax or pneumonia.  Unclear etiology of patient's symptoms however doubt emergent etiology that would require further work-up or admission to the hospital.  Vital signs have normalized and pt in no distress. Have advised close follow-up with her PCP for this symptom.  Will also give Rx for triamcinolone cream as her rash seems most consistent with pityriasis rosea.  Return precautions were discussed.  Patient voiced understanding of  the plan reasons to return to ED.  All questions answered.  ED Discharge Orders         Ordered    triamcinolone cream (KENALOG) 0.1 %  2 times daily     01/28/18 1353           Rhealynn Myhre S, PA-C 01/28/18 1425    Long, Arlyss Repress, MD 01/28/18 2031

## 2022-03-27 ENCOUNTER — Telehealth: Payer: Medicaid Other | Admitting: Physician Assistant

## 2022-03-27 DIAGNOSIS — R3989 Other symptoms and signs involving the genitourinary system: Secondary | ICD-10-CM

## 2022-03-27 MED ORDER — CEPHALEXIN 500 MG PO CAPS: 500.0000 mg | ORAL_CAPSULE | Freq: Two times a day (BID) | ORAL | 0 refills | Status: DC

## 2022-03-27 NOTE — Progress Notes (Signed)
Virtual Visit Consent   Frances Hall, you are scheduled for a virtual visit with a [place] provider today. Just as with appointments in the office, your consent must be obtained to participate. Your consent will be active for this visit and any virtual visit you may have with one of our providers in the next 365 days. If you have a MyChart account, a copy of this consent can be sent to you electronically.  As this is a virtual visit, video technology does not allow for your provider to perform a traditional examination. This may limit your provider's ability to fully assess your condition. If your provider identifies any concerns that need to be evaluated in person or the need to arrange testing (such as labs, EKG, etc.), we will make arrangements to do so. Although advances in technology are sophisticated, we cannot ensure that it will always work on either your end or our end. If the connection with a video visit is poor, the visit may have to be switched to a telephone visit. With either a video or telephone visit, we are not always able to ensure that we have a secure connection.  By engaging in this virtual visit, you consent to the provision of healthcare and authorize for your insurance to be billed (if applicable) for the services provided during this visit. Depending on your insurance coverage, you may receive a charge related to this service.  I need to obtain your verbal consent now. Are you willing to proceed with your visit today? Frances Hall has provided verbal consent on 03/27/2022 for a virtual visit (video or telephone). Mar Daring, PA-C  Date: 03/27/2022 12:28 PM  Virtual Visit via Video Note   I, Mar Daring, connected with  Frances Hall  ([id number], Jun 02, 1980) on 03/27/22 at 12:30 PM EST by a video-enabled telemedicine application and verified that I am speaking with the correct person using two identifiers.  Location: Patient: Virtual Visit  Location Patient: Home Provider: Virtual Visit Location Provider: Home Office   I discussed the limitations of evaluation and management by telemedicine and the availability of in person appointments. The patient expressed understanding and agreed to proceed.    History of Present Illness: Frances Hall is a 42 y.o. who identifies as a female who was assigned female at birth, and is being seen today for suspected uti.  HPI: Urinary Tract Infection  This is a new problem. The current episode started 1 to 4 weeks ago (symptoms started off and on since 03/10/22; worsened over last 3 days). The problem occurs every urination. The problem has been gradually worsening. The quality of the pain is described as burning and aching. The pain is moderate. There has been no fever. Associated symptoms include frequency, hesitancy and urgency. Pertinent negatives include no chills, hematuria, nausea or vomiting. Associated symptoms comments: fatigue. She has tried increased fluids for the symptoms. The treatment provided no relief. There is no history of recurrent UTIs.   Home test positive for Leukocytes and nitrites.   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 453.6, Disp: 30 g, Rfl: 0  Observations/Objective: Patient is well-developed, well-nourished in no acute distress.  Resting comfortably at home.  Head  is normocephalic, atraumatic.  No labored breathing.  Speech is clear and coherent with logical content.  Patient is alert and oriented at baseline.    Assessment and Plan: 1. Suspected UTI - cephALEXin (KEFLEX) 500 MG capsule; Take 1 capsule (500 mg total) by mouth 2 (two) times daily.  Dispense: 14 capsule; Refill: 0  - Worsening symptoms.  - Will treat empirically with Keflex - May use AZO for bladder spasms - Continue to push fluids.  - Seek in person evaluation for urine culture if symptoms do not improve or if they worsen.    Follow Up Instructions: I discussed the assessment and treatment plan with the patient. The patient was provided an opportunity to ask questions and all were answered. The patient agreed with the plan and demonstrated an understanding of the instructions.  A copy of instructions were sent to the patient via MyChart unless otherwise noted below.    The patient was advised to call back or seek an in-person evaluation if the symptoms worsen or if the condition fails to improve as anticipated.  Time:  I spent 9 minutes with the patient via telehealth technology discussing the above problems/concerns.    Mar Daring, PA-C

## 2022-03-27 NOTE — Patient Instructions (Signed)
Loma Sousa, thank you for joining Mar Daring, PA-C for today's virtual visit.  While this provider is not your primary care provider (PCP), if your PCP is located in our provider database this encounter information will be shared with them immediately following your visit.   [facility] account gives you access to today's visit and all your visits, tests, and labs performed at [facility] " click here if you don't have a [facility] account or go to [email or web address]  Consent: (Patient) Frances Hall provided verbal consent for this virtual visit at the beginning of the encounter.  Current Medications:  Current Outpatient Medications:    cephALEXin (KEFLEX) 500 MG capsule, Take 1 capsule (500 mg total) by mouth 2 (two) times daily., Disp: 14 capsule, Rfl: 0   clindamycin (CLEOCIN-T) 1 % external solution, Apply topically 2 (two) times daily., Disp: 30 mL, Rfl: 1   triamcinolone cream (KENALOG) 0.1 %, Apply 1 application topically 2 (two) times daily. 453.6, Disp: 30 g, Rfl: 0   Medications ordered in this encounter:  Meds ordered this encounter  Medications   cephALEXin (KEFLEX) 500 MG capsule    Sig: Take 1 capsule (500 mg total) by mouth 2 (two) times daily.    Dispense:  14 capsule    Refill:  0    Order Specific Question:   Supervising Provider    Answer:   Chase Picket [id number]     *If you need refills on other medications prior to your next appointment, please contact your pharmacy*  Follow-Up: Call back or seek an in-person evaluation if the symptoms worsen or if the condition fails to improve as anticipated.  [place] [phone]  Other Instructions  Urinary Tract Infection, Adult  A urinary tract infection (UTI) is an infection of any part of the urinary tract. The urinary tract includes the kidneys, ureters, bladder, and urethra. These organs make, store, and get rid of urine in the  body. An upper UTI affects the ureters and kidneys. A lower UTI affects the bladder and urethra. What are the causes? Most urinary tract infections are caused by bacteria in your genital area around your urethra, where urine leaves your body. These bacteria grow and cause inflammation of your urinary tract. What increases the risk? You are more likely to develop this condition if: You have a urinary catheter that stays in place. You are not able to control when you urinate or have a bowel movement (incontinence). You are female and you: Use a spermicide or diaphragm for birth control. Have low estrogen levels. Are pregnant. You have certain genes that increase your risk. You are sexually active. You take antibiotic medicines. You have a condition that causes your flow of urine to slow down, such as: An enlarged prostate, if you are female. Blockage in your urethra. A kidney stone. A nerve condition that affects your bladder control (neurogenic bladder). Not getting enough to drink, or not urinating often. You have certain medical conditions, such as: Diabetes. A weak disease-fighting system (immunesystem). Sickle cell disease. Gout. Spinal cord injury. What are the signs or symptoms? Symptoms of this condition include: Needing to urinate right away (urgency). Frequent urination. This may include small amounts of urine each time you urinate. Pain or burning with urination. Blood in the urine. Urine that smells bad or unusual. Trouble urinating. Cloudy urine. Vaginal discharge, if you are female. Pain in the abdomen or the lower back. You may also  have: Vomiting or a decreased appetite. Confusion. Irritability or tiredness. A fever or chills. Diarrhea. The first symptom in older adults may be confusion. In some cases, they may not have any symptoms until the infection has worsened. How is this diagnosed? This condition is diagnosed based on your medical history and a  physical exam. You may also have other tests, including: Urine tests. Blood tests. Tests for STIs (sexually transmitted infections). If you have had more than one UTI, a cystoscopy or imaging studies may be done to determine the cause of the infections. How is this treated? Treatment for this condition includes: Antibiotic medicine. Over-the-counter medicines to treat discomfort. Drinking enough water to stay hydrated. If you have frequent infections or have other conditions such as a kidney stone, you may need to see a health care provider who specializes in the urinary tract (urologist). In rare cases, urinary tract infections can cause sepsis. Sepsis is a life-threatening condition that occurs when the body responds to an infection. Sepsis is treated in the hospital with IV antibiotics, fluids, and other medicines. Follow these instructions at home:  Medicines Take over-the-counter and prescription medicines only as told by your health care provider. If you were prescribed an antibiotic medicine, take it as told by your health care provider. Do not stop using the antibiotic even if you start to feel better. General instructions Make sure you: Empty your bladder often and completely. Do not hold urine for long periods of time. Empty your bladder after sex. Wipe from front to back after urinating or having a bowel movement if you are female. Use each tissue only one time when you wipe. Drink enough fluid to keep your urine pale yellow. Keep all follow-up visits. This is important. Contact a health care provider if: Your symptoms do not get better after 1-2 days. Your symptoms go away and then return. Get help right away if: You have severe pain in your back or your lower abdomen. You have a fever or chills. You have nausea or vomiting. Summary A urinary tract infection (UTI) is an infection of any part of the urinary tract, which includes the kidneys, ureters, bladder, and  urethra. Most urinary tract infections are caused by bacteria in your genital area. Treatment for this condition often includes antibiotic medicines. If you were prescribed an antibiotic medicine, take it as told by your health care provider. Do not stop using the antibiotic even if you start to feel better. Keep all follow-up visits. This is important. This information is not intended to replace advice given to you by your health care provider. Make sure you discuss any questions you have with your health care provider. Document Revised: 10/20/2019 Document Reviewed: 10/20/2019 Elsevier Patient Education  [place].    If you have been instructed to have an in-person evaluation today at a local Urgent Care facility, please use the link below. It will take you to a list of all of our available [facility], including address, phone number and hours of operation. Please do not delay care.  [facility]  If you or a family member do not have a primary care provider, use the link below to schedule a visit and establish care. When you choose a [facility] primary care physician or advanced practice provider, you gain a long-term partner in health. Find a Primary Care Provider  Learn more about [facility]'s in-office and virtual care options: [place] Now

## 2022-04-01 ENCOUNTER — Telehealth: Payer: Medicaid Other | Admitting: Nurse Practitioner

## 2022-04-01 DIAGNOSIS — N3000 Acute cystitis without hematuria: Secondary | ICD-10-CM

## 2022-04-01 MED ORDER — SULFAMETHOXAZOLE-TRIMETHOPRIM 800-160 MG PO TABS: 1.0000 | ORAL_TABLET | Freq: Two times a day (BID) | ORAL | 0 refills | Status: AC

## 2022-04-01 NOTE — Progress Notes (Signed)
Virtual Visit Consent   Frances Hall, you are scheduled for a virtual visit with a [facility] provider today. Just as with appointments in the office, your consent must be obtained to participate. Your consent will be active for this visit and any virtual visit you may have with one of our providers in the next 365 days. If you have a MyChart account, a copy of this consent can be sent to you electronically.  As this is a virtual visit, video technology does not allow for your provider to perform a traditional examination. This may limit your provider's ability to fully assess your condition. If your provider identifies any concerns that need to be evaluated in person or the need to arrange testing (such as labs, EKG, etc.), we will make arrangements to do so. Although advances in technology are sophisticated, we cannot ensure that it will always work on either your end or our end. If the connection with a video visit is poor, the visit may have to be switched to a telephone visit. With either a video or telephone visit, we are not always able to ensure that we have a secure connection.  By engaging in this virtual visit, you consent to the provision of healthcare and authorize for your insurance to be billed (if applicable) for the services provided during this visit. Depending on your insurance coverage, you may receive a charge related to this service.  I need to obtain your verbal consent now. Are you willing to proceed with your visit today? Frances Hall has provided verbal consent on 04/01/2022 for a virtual visit (video or telephone). Apolonio Schneiders, FNP  Date: 04/01/2022 5:46 PM  Virtual Visit via Video Note   I, Apolonio Schneiders, connected with  Frances Hall  ([id number], 1980-06-17) on 04/01/22 at  6:00 PM EST by a video-enabled telemedicine application and verified that I am speaking with the correct person using two identifiers.  Location: Patient: Virtual Visit Location  Patient: Home Provider: Virtual Visit Location Provider: Home Office   I discussed the limitations of evaluation and management by telemedicine and the availability of in person appointments. The patient expressed understanding and agreed to proceed.    History of Present Illness: Frances Hall is a 42 y.o. who identifies as a female who was assigned female at birth, and is being seen today for ongoing UTI symptoms.  She did take a home UTI test that was positive   She was seen on 03/27/22 with UTI symptoms and started on Keflex, does not feel that she has made much improvement  Feels more fatigue  Increased frequency in urination  Denies pain with urination or burning   Feels like she is having some abdominal cramping similar to prior to menstraul symptoms   No back pain, no N/V no fever   She has not had any recent treatment for UTI Denies vaginal discharge  Denies bladder surgery history   Feels she has suffered from overactive bladder in the past without diagnosis or treatment   She is under the care of an OBGYN, she has had an abnormal pap in the past  She does have an upcoming OBGYN appointment   Does not have a PCP most recent labwork was 2019  She is not pregnant nor planning on becoming pregnant   Problems:  Patient Active Problem List   Diagnosis Date Noted   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 453.6, Disp: 30 g, Rfl:  0  Observations/Objective: Patient is well-developed, well-nourished in no acute distress.  Resting comfortably  at home.  Head is normocephalic, atraumatic.  No labored breathing.  Speech is clear and coherent with logical content.  Patient is alert and oriented at baseline.    Assessment and Plan: 1. Acute cystitis without hematuria Stop Keflex tonight and transition to Bactrim   - sulfamethoxazole-trimethoprim (BACTRIM DS) 800-160 MG tablet; Take 1 tablet by mouth 2 (two) times daily for 5 days.  Dispense: 10 tablet; Refill: 0    New PCP appointment scheduled for 04/08/22 OBGYN scheduled for following week  Emphasized follow up for worsening symptoms including but not limited to fever/back pain/N&V Follow Up Instructions: I discussed the assessment and treatment plan with the patient. The patient was provided an opportunity to ask questions and all were answered. The patient agreed with the plan and demonstrated an understanding of the instructions.  A copy of instructions were sent to the patient via MyChart unless otherwise noted below.    The patient was advised to call back or seek an in-person evaluation if the symptoms worsen or if the condition fails to improve as anticipated.  Time:  I spent 10 minutes with the patient via telehealth technology discussing the above problems/concerns.    Apolonio Schneiders, FNP

## 2022-04-01 NOTE — Patient Instructions (Addendum)
New Patient Visit with:  Jeanie Sewer Wednesday April 08, 2022 Arrive by 10:25 AM EST Starts at 10:40 AM EST (40 minutes) Add to calendar [place] [id number]  [phone]

## 2022-04-08 ENCOUNTER — Encounter: Payer: Self-pay | Admitting: Family

## 2022-04-08 ENCOUNTER — Ambulatory Visit: Payer: Medicaid Other | Admitting: Family

## 2022-04-08 VITALS — BP 134/85 | HR 63 | Temp 97.2°F | Ht <= 58 in | Wt 152.4 lb

## 2022-04-08 DIAGNOSIS — N943 Premenstrual tension syndrome: Secondary | ICD-10-CM | POA: Diagnosis not present

## 2022-04-08 DIAGNOSIS — E559 Vitamin D deficiency, unspecified: Secondary | ICD-10-CM | POA: Diagnosis not present

## 2022-04-08 DIAGNOSIS — R5383 Other fatigue: Secondary | ICD-10-CM | POA: Diagnosis not present

## 2022-04-08 DIAGNOSIS — Z13220 Encounter for screening for lipoid disorders: Secondary | ICD-10-CM | POA: Diagnosis not present

## 2022-04-08 DIAGNOSIS — Z136 Encounter for screening for cardiovascular disorders: Secondary | ICD-10-CM | POA: Diagnosis not present

## 2022-04-08 LAB — COMPREHENSIVE METABOLIC PANEL
ALT: 36 U/L — ABNORMAL HIGH (ref 0–35)
AST: 20 U/L (ref 0–37)
Albumin: 4.5 g/dL (ref 3.5–5.2)
Alkaline Phosphatase: 60 U/L (ref 39–117)
BUN: 15 mg/dL (ref 6–23)
CO2: 27 mEq/L (ref 19–32)
Calcium: 9.4 mg/dL (ref 8.4–10.5)
Chloride: 105 mEq/L (ref 96–112)
Creatinine, Ser: 0.81 mg/dL (ref 0.40–1.20)
GFR: 90.46 mL/min (ref >60.00)
Glucose, Bld: 98 mg/dL (ref 70–99)
Potassium: 3.9 mEq/L (ref 3.5–5.1)
Sodium: 139 mEq/L (ref 135–145)
Total Bilirubin: 0.8 mg/dL (ref 0.2–1.2)
Total Protein: 7.1 g/dL (ref 6.0–8.3)

## 2022-04-08 LAB — CBC WITH DIFFERENTIAL/PLATELET
Basophils Absolute: 0 10*3/uL (ref 0.0–0.1)
Basophils Relative: 0.6 % (ref 0.0–3.0)
Eosinophils Absolute: 0.3 10*3/uL (ref 0.0–0.7)
Eosinophils Relative: 3.9 % (ref 0.0–5.0)
HCT: 41.9 % (ref 36.0–46.0)
Hemoglobin: 14.1 g/dL (ref 12.0–15.0)
Lymphocytes Relative: 21.3 % (ref 12.0–46.0)
Lymphs Abs: 1.4 10*3/uL (ref 0.7–4.0)
MCHC: 33.7 g/dL (ref 30.0–36.0)
MCV: 90.8 fl (ref 78.0–100.0)
Monocytes Absolute: 0.4 10*3/uL (ref 0.1–1.0)
Monocytes Relative: 6.8 % (ref 3.0–12.0)
Neutro Abs: 4.4 10*3/uL (ref 1.4–7.7)
Neutrophils Relative %: 67.4 % (ref 43.0–77.0)
Platelets: 266 10*3/uL (ref 150.0–400.0)
RBC: 4.62 Mil/uL (ref 3.87–5.11)
RDW: 13.8 % (ref 11.5–15.5)
WBC: 6.6 10*3/uL (ref 4.0–10.5)

## 2022-04-08 LAB — LIPID PANEL
Cholesterol: 158 mg/dL (ref 0–200)
HDL: 39.2 mg/dL (ref >39.00)
LDL Cholesterol: 102 mg/dL — ABNORMAL HIGH (ref 0–99)
NonHDL: 118.41
Total CHOL/HDL Ratio: 4
Triglycerides: 83 mg/dL (ref 0.0–149.0)
VLDL: 16.6 mg/dL (ref 0.0–40.0)

## 2022-04-08 LAB — TSH: TSH: 1.8 u[IU]/mL (ref 0.35–5.50)

## 2022-04-08 NOTE — Patient Instructions (Signed)
Welcome to [facility], It was a pleasure meeting you today!   I will review your lab results via MyChart in a few days.  See the attached handouts for more information regarding your symptoms.  We can discuss more after your labs are back and after you have seen your gynecologist.         PLEASE NOTE:  If you had any lab tests please let us know if you have not heard back within a few days. You may see your results on MyChart before we have a chance to review them but we will give you a call once they are reviewed by [place]. If we ordered any referrals today, please let us know if you have not heard from their office within the next week.        PLEASE NOTE: If you had any LAB tests please let us know if you have not heard back within a few days. You may see your results on MyChart before we have a chance to review them but we will give you a call once they are reviewed by [place]. If we ordered any REFERRALS today, please let us know if you have not heard from their office within the next week.  Let us know through MyChart if you are needing REFILLS, or have your pharmacy send [place] the request. You can also use MyChart to communicate with me or any office staff.

## 2022-04-08 NOTE — Progress Notes (Deleted)
   New Patient Office Visit  Subjective:  Patient ID: Frances Hall, female    DOB: 25-Nov-1980  Age: 42 y.o. MRN: [id number]  CC:  Chief Complaint  Patient presents with   New Patient (Initial Visit)   Fatigue    Pt c/o fatigue off and on for years. Pt states she is unable to get enough sleep.    Annual Exam    Pt has had a banana but would like a physical     HPI JAEDYN Hall presents for establishing care today.     Assessment & Plan:   Problem List Items Addressed This Visit   None   Subjective:    Outpatient Medications Prior to Visit  Medication Sig Dispense Refill   cephALEXin (KEFLEX) 500 MG capsule Take 1 capsule (500 mg total) by mouth 2 (two) times daily. 14 capsule 0   clindamycin (CLEOCIN-T) 1 % external solution Apply topically 2 (two) times daily. 30 mL 1   triamcinolone cream (KENALOG) 0.1 % Apply 1 application topically 2 (two) times daily. 453.6 30 g 0   No facility-administered medications prior to visit.   History reviewed. No pertinent past medical history. Past Surgical History:  Procedure Laterality Date   CERVICAL BIOPSY  W/ LOOP ELECTRODE EXCISION Bilateral    WISDOM TOOTH EXTRACTION      Objective:   Today's Vitals: BP 134/85 (BP Location: Left Arm, Patient Position: Sitting, Cuff Size: Large)   Pulse 63   Temp (!) 97.2 F (36.2 C) (Temporal)   Ht 4\' 9"  (1.448 m)   Wt 152 lb 6 oz (69.1 kg)   LMP 03/17/2022 (Exact Date)   SpO2 98%   BMI 32.97 kg/m   Physical Exam  No orders of the defined types were placed in this encounter.   Jeanie Sewer, NP

## 2022-04-08 NOTE — Progress Notes (Signed)
   New Patient Office Visit  Subjective:  Patient ID: Frances Hall, female    DOB: 1980-08-22  Age: 42 y.o. MRN: [id number]  CC:  Chief Complaint  Patient presents with   New Patient (Initial Visit)   Fatigue    Pt c/o fatigue off and on for years. Pt states she is unable to get enough sleep.    Annual Exam    Non- fasting  but would like a physical     HPI Frances Hall presents for establishing care today.  Fatigue:  reports sx mostly about 2 weeks prior to her menstrual cycle, then seems better after her cycle starts.  She also describes having mood swings with the fatigue and headaches. pt does not exercise. Mild depression noted on PHQ9 screening.   Assessment & Plan:   Problem List Items Addressed This Visit       Other   Premenstrual syndrome    chronic  reports sx occur about 2 weeks prior to menses onset fatigue, mood swings, and headaches discussed meds, counseling, handout provided checking labs today to r/o other concerns pt to see her GYN next month & will discuss concerns also f/u prn      Other Visit Diagnoses     Other fatigue    -  Primary   Relevant Orders   Comp Met (CMET) (Completed)   TSH (Completed)   CBC with Differential/Platelet (Completed)   Vitamin D (25 hydroxy)   Encounter for lipid screening for cardiovascular disease       Relevant Orders   Lipid panel (Completed)       Subjective:    Outpatient Medications Prior to Visit  Medication Sig Dispense Refill   cephALEXin (KEFLEX) 500 MG capsule Take 1 capsule (500 mg total) by mouth 2 (two) times daily. 14 capsule 0   clindamycin (CLEOCIN-T) 1 % external solution Apply topically 2 (two) times daily. 30 mL 1   triamcinolone cream (KENALOG) 0.1 % Apply 1 application topically 2 (two) times daily. 453.6 30 g 0   No facility-administered medications prior to visit.   Past Medical History:  Diagnosis Date   Healthcare maintenance 08/31/2013   Past Surgical History:   Procedure Laterality Date   CERVICAL BIOPSY  W/ LOOP ELECTRODE EXCISION Bilateral    WISDOM TOOTH EXTRACTION      Objective:   Today's Vitals: BP 134/85 (BP Location: Left Arm, Patient Position: Sitting, Cuff Size: Large)   Pulse 63   Temp (!) 97.2 F (36.2 C) (Temporal)   Ht 4\' 9"  (1.448 m)   Wt 152 lb 6 oz (69.1 kg)   LMP 03/17/2022 (Exact Date)   SpO2 98%   BMI 32.97 kg/m   Physical Exam Vitals and nursing note reviewed.  Constitutional:      Appearance: Normal appearance. She is obese.  Cardiovascular:     Rate and Rhythm: Normal rate and regular rhythm.  Pulmonary:     Effort: Pulmonary effort is normal.     Breath sounds: Normal breath sounds.  Musculoskeletal:        General: Normal range of motion.  Skin:    General: Skin is warm and dry.  Neurological:     Mental Status: She is alert.  Psychiatric:        Mood and Affect: Mood normal.        Behavior: Behavior normal.     Jeanie Sewer, NP

## 2022-04-08 NOTE — Assessment & Plan Note (Signed)
chronic  reports sx occur about 2 weeks prior to menses onset fatigue, mood swings, and headaches discussed meds, counseling, handout provided checking labs today to r/o other concerns pt to see her GYN next month & will discuss concerns also f/u prn

## 2022-04-12 NOTE — Progress Notes (Signed)
All of your labs are within normal range! Glucose (sugar), electrolytes, kidney and liver function, blood count, thyroid, and cholesterol numbers are all good.   Keep up the good work with controlling your diet and continue to try and shoot for 30 minutes of exercise daily!

## 2022-04-14 LAB — VITAMIN D 25 HYDROXY (VIT D DEFICIENCY, FRACTURES): VITD: 12.59 ng/mL — ABNORMAL LOW (ref 30.00–100.00)

## 2022-04-15 DIAGNOSIS — R350 Frequency of micturition: Secondary | ICD-10-CM | POA: Diagnosis not present

## 2022-04-15 DIAGNOSIS — R8271 Bacteriuria: Secondary | ICD-10-CM | POA: Diagnosis not present

## 2022-04-15 MED ORDER — VITAMIN D3 1.25 MG (50000 UT) PO CAPS: 1.0000 | ORAL_CAPSULE | ORAL | 0 refills | Status: AC

## 2022-04-15 NOTE — Progress Notes (Signed)
Your Vitamin D was slow in resulting, but it is low and recommend taking a RX to bring your level up. Vit D is important for our immune system and absorbing calcium, sometimes can cause fatigue when low. I have sent over a RX and this is taken just WEEKLY for 3 months and then start an over the counter capsule, 2,000units daily. Any questions, let me know!

## 2022-04-15 NOTE — Addendum Note (Signed)
Addended byJeanie Sewer on: 04/15/2022 01:46 PM   Modules accepted: Orders

## 2022-05-14 ENCOUNTER — Telehealth: Payer: Medicaid Other | Admitting: Physician Assistant

## 2022-05-14 DIAGNOSIS — R3989 Other symptoms and signs involving the genitourinary system: Secondary | ICD-10-CM | POA: Diagnosis not present

## 2022-05-14 MED ORDER — SULFAMETHOXAZOLE-TRIMETHOPRIM 800-160 MG PO TABS: 1.0000 | ORAL_TABLET | Freq: Two times a day (BID) | ORAL | 0 refills | Status: DC

## 2022-05-14 NOTE — Patient Instructions (Signed)
Loma Sousa, thank you for joining Leeanne Rio, PA-C for today's virtual visit.  While this provider is not your primary care provider (PCP), if your PCP is located in our provider database this encounter information will be shared with them immediately following your visit.   [facility] account gives you access to today's visit and all your visits, tests, and labs performed at [facility] " click here if you don't have a [facility] account or go to [email or web address]  Consent: (Patient) Frances Hall provided verbal consent for this virtual visit at the beginning of the encounter.  Current Medications:  Current Outpatient Medications:    sulfamethoxazole-trimethoprim (BACTRIM DS) 800-160 MG tablet, Take 1 tablet by mouth 2 (two) times daily., Disp: 14 tablet, Rfl: 0   Cholecalciferol (VITAMIN D3) 1.25 MG (50000 UT) CAPS, Take 1 capsule (1.25 mg total) by mouth once a week. AFTER the last weekly pill, start a daily OTC Vitamin D3 up to 2,000units., Disp: 12 capsule, Rfl: 0   Medications ordered in this encounter:  Meds ordered this encounter  Medications   sulfamethoxazole-trimethoprim (BACTRIM DS) 800-160 MG tablet    Sig: Take 1 tablet by mouth 2 (two) times daily.    Dispense:  14 tablet    Refill:  0    Order Specific Question:   Supervising Provider    Answer:   Chase Picket [id number]     *If you need refills on other medications prior to your next appointment, please contact your pharmacy*  Follow-Up: Call back or seek an in-person evaluation if the symptoms worsen or if the condition fails to improve as anticipated.  [place] [phone]  Other Instructions Your symptoms are consistent with a bladder infection, also called acute cystitis. Please take your antibiotic (Bactrim) as directed until all pills are gone.  Stay very well hydrated.  Consider a daily probiotic (Align, Culturelle, or  Activia) to help prevent stomach upset caused by the antibiotic.  Taking a probiotic daily may also help prevent recurrent UTIs.  Also consider taking AZO (Phenazopyridine) tablets to help decrease pain with urination.   Urinary Tract Infection A urinary tract infection (UTI) can occur any place along the urinary tract. The tract includes the kidneys, ureters, bladder, and urethra. A type of germ called bacteria often causes a UTI. UTIs are often helped with antibiotic medicine.  HOME CARE  If given, take antibiotics as told by your doctor. Finish them even if you start to feel better. Drink enough fluids to keep your pee (urine) clear or pale yellow. Avoid tea, drinks with caffeine, and bubbly (carbonated) drinks. Pee often. Avoid holding your pee in for a long time. Pee before and after having sex (intercourse). Wipe from front to back after you poop (bowel movement) if you are a woman. Use each tissue only once. GET HELP RIGHT AWAY IF:  You have back pain. You have lower belly (abdominal) pain. You have chills. You feel sick to your stomach (nauseous). You throw up (vomit). Your burning or discomfort with peeing does not go away. You have a fever. Your symptoms are not better in 3 days. MAKE SURE YOU:  Understand these instructions. Will watch your condition. Will get help right away if you are not doing well or get worse. Document Released: 08/26/2007 Document Revised: 12/02/2011 Document Reviewed: 10/08/2011 [facility] Patient Information 2015 [place], [place]. This information is not intended to replace advice given to you by your health care  provider. Make sure you discuss any questions you have with your health care provider.    If you have been instructed to have an in-person evaluation today at a local Urgent Care facility, please use the link below. It will take you to a list of all of our available [facility], including address, phone number and hours of operation.  Please do not delay care.  [facility]  If you or a family member do not have a primary care provider, use the link below to schedule a visit and establish care. When you choose a [facility] primary care physician or advanced practice provider, you gain a long-term partner in health. Find a Primary Care Provider  Learn more about [facility]'s in-office and virtual care options: [facility] Now

## 2022-05-14 NOTE — Progress Notes (Signed)
Virtual Visit Consent   Frances Hall, you are scheduled for a virtual visit with a [facility] provider today. Just as with appointments in the office, your consent must be obtained to participate. Your consent will be active for this visit and any virtual visit you may have with one of our providers in the next 365 days. If you have a MyChart account, a copy of this consent can be sent to you electronically.  As this is a virtual visit, video technology does not allow for your provider to perform a traditional examination. This may limit your provider's ability to fully assess your condition. If your provider identifies any concerns that need to be evaluated in person or the need to arrange testing (such as labs, EKG, etc.), we will make arrangements to do so. Although advances in technology are sophisticated, we cannot ensure that it will always work on either your end or our end. If the connection with a video visit is poor, the visit may have to be switched to a telephone visit. With either a video or telephone visit, we are not always able to ensure that we have a secure connection.  By engaging in this virtual visit, you consent to the provision of healthcare and authorize for your insurance to be billed (if applicable) for the services provided during this visit. Depending on your insurance coverage, you may receive a charge related to this service.  I need to obtain your verbal consent now. Are you willing to proceed with your visit today? Frances Hall has provided verbal consent on 05/14/2022 for a virtual visit (video or telephone). Leeanne Rio, [place]  Date: 05/14/2022 5:56 PM  Virtual Visit via Video Note   I, Leeanne Rio, connected with  Frances Hall  ([id number], Nov 01, 1980) on 05/14/22 at  6:00 PM EST by a video-enabled telemedicine application and verified that I am speaking with the correct person using two identifiers.  Location: Patient: Virtual Visit  Location Patient: Home Provider: Virtual Visit Location Provider: Home Office   I discussed the limitations of evaluation and management by telemedicine and the availability of in person appointments. The patient expressed understanding and agreed to proceed.    History of Present Illness: Frances Hall is a 42 y.o. who identifies as a female who was assigned female at birth, and is being seen today for possible UTI. Endorses symptoms starting today on waking up but worsened through the day. Notes some fatigue the past 2 days but unsure if related. Today with dysuria, urinary urgency, lower back pain. Denies fever, chills, nausea/vomiting, flank pain. Took a home urinary test + LE and nitrites. Last UTI about 6 weeks or so ago, treated with Bactrim with resolution.   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 AFTER the last weekly pill, start a daily OTC Vitamin D3 up to 2,000units., Disp: 12 capsule, Rfl: 0  Observations/Objective: Patient is well-developed, well-nourished in no acute distress.  Resting  comfortably at home.  Head is normocephalic, atraumatic.  No labored breathing. Speech is clear and coherent with logical content.  Patient is alert and oriented at baseline.   Assessment and Plan: 1. Suspected UTI - sulfamethoxazole-trimethoprim (BACTRIM DS) 800-160 MG tablet; Take 1 tablet by mouth 2 (two) times daily.  Dispense: 14 tablet; Refill: 0  Classic  UTI symptoms with absence of alarm signs or symptoms. Prior history of UTI. Will treat empirically with Bactrim for suspected uncomplicated cystitis. Supportive measures and OTC medications reviewed. Strict in-person evaluation precautions discussed.    Follow Up Instructions: I discussed the assessment and treatment plan with the patient. The patient was provided an opportunity to ask questions and all were answered. The patient agreed with the plan and demonstrated an understanding of the instructions.  A copy of instructions were sent to the patient via MyChart unless otherwise noted below.    The patient was advised to call back or seek an in-person evaluation if the symptoms worsen or if the condition fails to improve as anticipated.  Time:  I spent 10 minutes with the patient via telehealth technology discussing the above problems/concerns.    Leeanne Rio, PA-C

## 2022-06-01 DIAGNOSIS — R102 Pelvic and perineal pain: Secondary | ICD-10-CM | POA: Diagnosis not present

## 2022-06-01 DIAGNOSIS — R350 Frequency of micturition: Secondary | ICD-10-CM | POA: Diagnosis not present

## 2022-06-30 DIAGNOSIS — R102 Pelvic and perineal pain: Secondary | ICD-10-CM | POA: Diagnosis not present

## 2022-06-30 DIAGNOSIS — K4090 Unilateral inguinal hernia, without obstruction or gangrene, not specified as recurrent: Secondary | ICD-10-CM | POA: Diagnosis not present

## 2022-07-03 ENCOUNTER — Encounter: Payer: Self-pay | Admitting: Family

## 2022-07-03 ENCOUNTER — Ambulatory Visit: Payer: Medicaid Other | Admitting: Family

## 2022-07-03 VITALS — BP 132/77 | HR 62 | Temp 97.6°F | Ht <= 58 in | Wt 150.8 lb

## 2022-07-03 DIAGNOSIS — Z113 Encounter for screening for infections with a predominantly sexual mode of transmission: Secondary | ICD-10-CM

## 2022-07-03 DIAGNOSIS — N926 Irregular menstruation, unspecified: Secondary | ICD-10-CM

## 2022-07-03 NOTE — Progress Notes (Signed)
   Patient ID: Frances Hall, female    DOB: [birth year], 42 y.o.   MRN: [id number]  Chief Complaint  Patient presents with   Hormonal Testing    HPI: Menstrual changes:  states her menses this month came early, feeling sx of how she was pregnant in past, sore breasts, mild nausea, some headaches. States her cycle has been regular in previous months. And she sometimes she has her sx before her cycles but this time has been after which has never happened before.    Assessment & Plan:  Screening examination for STD (sexually transmitted disease) -  -     HIV Antibody (routine testing w rflx) -     RPR  Menstrual changes - pt reports checking UPT at home and negative, she wants to confirm via serum as she has never had current sx before except when she was pregnant in past. Advised it is not unusual to have menstrual fluctuations after being regular for a long time - could be related to stress/anxiety, weight changes, or r/t peri-menopause. Advised that Estradiol or progesterone levels while still menstruating are not reliable.  -     hCG, serum, qualitative   Subjective:    Outpatient Medications Prior to Visit  Medication Sig Dispense Refill   Cholecalciferol (VITAMIN D3) 1.25 MG (50000 UT) CAPS Take 1 capsule (1.25 mg total) by mouth once a week. AFTER the last weekly pill, start a daily OTC Vitamin D3 up to 2,000units. 12 capsule 0   sulfamethoxazole-trimethoprim (BACTRIM DS) 800-160 MG tablet Take 1 tablet by mouth 2 (two) times daily. 14 tablet 0   No facility-administered medications prior to visit.   Past Medical History:  Diagnosis Date   Healthcare maintenance 08/31/2013   Past Surgical History:  Procedure Laterality Date   CERVICAL BIOPSY  W/ LOOP ELECTRODE EXCISION Bilateral    WISDOM TOOTH EXTRACTION     No Known Allergies    Objective:    Physical Exam Vitals and nursing note reviewed.  Constitutional:      Appearance: Normal appearance.  Cardiovascular:      Rate and Rhythm: Normal rate and regular rhythm.  Pulmonary:     Effort: Pulmonary effort is normal.     Breath sounds: Normal breath sounds.  Musculoskeletal:        General: Normal range of motion.  Skin:    General: Skin is warm and dry.  Neurological:     Mental Status: She is alert.  Psychiatric:        Mood and Affect: Mood normal.        Behavior: Behavior normal.    BP 132/77 (BP Location: Left Arm, Patient Position: Sitting, Cuff Size: Normal)   Pulse 62   Temp 97.6 F (36.4 C) (Temporal)   Ht 4\' 9"  (1.448 m)   Wt 150 lb 12.8 oz (68.4 kg)   LMP 06/22/2022 (Exact Date)   SpO2 99%   BMI 32.63 kg/m  Wt Readings from Last 3 Encounters:  07/03/22 150 lb 12.8 oz (68.4 kg)  04/08/22 152 lb 6 oz (69.1 kg)  02/03/16 156 lb (70.8 kg)      Dulce Sellar, NP

## 2022-07-04 ENCOUNTER — Other Ambulatory Visit: Payer: Self-pay | Admitting: Family

## 2022-07-04 DIAGNOSIS — E559 Vitamin D deficiency, unspecified: Secondary | ICD-10-CM

## 2022-07-04 LAB — HCG, SERUM, QUALITATIVE: Preg, Serum: NEGATIVE

## 2022-07-04 LAB — HIV ANTIBODY (ROUTINE TESTING W REFLEX): HIV 1&2 Ab, 4th Generation: NONREACTIVE

## 2022-07-04 LAB — RPR: RPR Ser Ql: NONREACTIVE

## 2022-08-24 ENCOUNTER — Ambulatory Visit: Payer: Medicaid Other | Admitting: Family

## 2022-08-27 ENCOUNTER — Ambulatory Visit: Payer: Medicaid Other | Admitting: Family

## 2022-08-27 ENCOUNTER — Encounter: Payer: Self-pay | Admitting: Family

## 2022-08-27 VITALS — BP 138/87 | HR 73 | Temp 97.1°F | Ht <= 58 in | Wt 153.5 lb

## 2022-08-27 DIAGNOSIS — R5383 Other fatigue: Secondary | ICD-10-CM | POA: Diagnosis not present

## 2022-08-27 DIAGNOSIS — R300 Dysuria: Secondary | ICD-10-CM

## 2022-08-27 LAB — POCT URINALYSIS DIPSTICK
Bilirubin, UA: NEGATIVE
Blood, UA: NEGATIVE
Glucose, UA: NEGATIVE
Ketones, UA: NEGATIVE
Leukocytes, UA: NEGATIVE
Nitrite, UA: NEGATIVE
Protein, UA: NEGATIVE
Spec Grav, UA: >=1.03 — AB (ref 1.010–1.025)
Urobilinogen, UA: 0.2 E.U./dL
pH, UA: 6 (ref 5.0–8.0)

## 2022-08-27 NOTE — Progress Notes (Signed)
   Patient ID: Frances Hall, female    DOB: 01-22-1981, 42 y.o.   MRN: [id number]  Chief Complaint  Patient presents with   Dizziness    Pt c/o dizziness, fatigue and brain fog when standing or bending over.    Dysuria    HPI:      Dizziness/fatigue/brain fog:  reports feeling if she stands too fast or when she bends over. BP, HR wnl.  Still having a lot of fatigue, feeling drowsy, sometimes has to take a nap during the day because of tiredness. Reports sleeping well, at least 7 hours of sleep. Denies any depression sx. Thinks she is having hormone changes contributing to her sx.   Dysuria:  Pt c/o dysuria, lower abdominal and low back pain and breast tenderness for about couple of weeks. Currently spotting. Denies nausea, fever, or foul odor.      Assessment & Plan:  1. Dysuria UA neg. Continue to advise on drinking at least 2L of water daily.  - POCT Urinalysis Dipstick  2. Fatigue, unspecified type advised pt I don't check female hormones for perimenopause as they are unreliable if still having menses, d/t fluctuations, but we try to manage the sx. Advised she discuss with her GYN for further assessment and I can refer if needed. CBC & TSH normal w/last check, Vit D low, she finished the weekly RX and switched to OTC dose. Continue to encourage increasing exercise, avoiding too much sugar in diet, hydrating w/at least 2L water qd, can try OTC Vitamin B complex OTC.   Subjective:    Outpatient Medications Prior to Visit  Medication Sig Dispense Refill   Cholecalciferol (VITAMIN D3) 1.25 MG (50000 UT) CAPS Take 1 capsule (1.25 mg total) by mouth once a week. AFTER the last weekly pill, start a daily OTC Vitamin D3 up to 2,000units. (Patient not taking: Reported on 08/27/2022) 12 capsule 0   No facility-administered medications prior to visit.   Past Medical History:  Diagnosis Date   Healthcare maintenance 08/31/2013   Past Surgical History:  Procedure Laterality Date    CERVICAL BIOPSY  W/ LOOP ELECTRODE EXCISION Bilateral    WISDOM TOOTH EXTRACTION     No Known Allergies    Objective:    Physical Exam Vitals and nursing note reviewed.  Constitutional:      Appearance: Normal appearance. She is obese.  Cardiovascular:     Rate and Rhythm: Normal rate and regular rhythm.  Pulmonary:     Effort: Pulmonary effort is normal.     Breath sounds: Normal breath sounds.  Musculoskeletal:        General: Normal range of motion.  Skin:    General: Skin is warm and dry.  Neurological:     Mental Status: She is alert.  Psychiatric:        Mood and Affect: Mood normal.        Behavior: Behavior normal.    BP 138/87 (BP Location: Left Arm, Patient Position: Sitting, Cuff Size: Large)   Pulse 73   Temp (!) 97.1 F (36.2 C) (Temporal)   Ht 4\' 9"  (1.448 m)   Wt 153 lb 8 oz (69.6 kg)   LMP 08/04/2022 (Exact Date)   SpO2 99%   BMI 33.22 kg/m  Wt Readings from Last 3 Encounters:  08/27/22 153 lb 8 oz (69.6 kg)  07/03/22 150 lb 12.8 oz (68.4 kg)  04/08/22 152 lb 6 oz (69.1 kg)      Dulce Sellar, NP

## 2022-09-01 ENCOUNTER — Ambulatory Visit: Payer: Medicaid Other | Admitting: Family

## 2022-10-07 ENCOUNTER — Encounter: Payer: Self-pay | Admitting: Obstetrics and Gynecology

## 2022-10-07 ENCOUNTER — Ambulatory Visit: Payer: Medicaid Other | Admitting: Obstetrics and Gynecology

## 2022-10-07 ENCOUNTER — Other Ambulatory Visit: Payer: Self-pay

## 2022-10-07 VITALS — BP 126/71 | HR 84 | Wt 151.7 lb

## 2022-10-07 DIAGNOSIS — R350 Frequency of micturition: Secondary | ICD-10-CM

## 2022-10-07 DIAGNOSIS — N941 Unspecified dyspareunia: Secondary | ICD-10-CM | POA: Diagnosis not present

## 2022-10-07 NOTE — Progress Notes (Signed)
NEW GYNECOLOGY PATIENT Patient name: Frances Hall MRN [id number]  Date of birth: [birth year] Chief Complaint:   new gyn     History:  Frances Hall is a 42 y.o. G2P2002 being seen today for dypareunia.  Pain with interourse for uyears and feels liek a bulge in her pelvis or pressure on her bladder. Sore with abdominal exams. Random days of rand bleeing for a few  hours or for a day at a time. Has tried lube - no issue of vaginal dryness. Had biopsy on Friday  Feels like something is being hit and constantly sore and when she pushes on her 'bladder' it is sore to touch. No pain after intercourse. Not at the entry of the vagina.  No pain with urination - feels she has been getting UTIs. Last treated for a UTI in the last 2 months and that is the only time she was treated. Having urinary frequency and pelvic pressure and achy sensation without actual pain with urination. Feeling not completely emptying the bladder.  Day voids: every 20 min to 60/90 min; feels she has to void soon after drinking  Incontinence: felt like it has improved; with urge (drops of urine)  Night voids: none  Water intake: 4 16oz of water No coffee in months Alcohol: wine, intermittent (last 1 week ago)  Soda: none in about 1 month  Occasional vaginal dryness; after being engaged in intercourse for a while or multiple times Small babies: under 7lbs Sexual trauma in childhood  No occupational risks Has chronic low back pain (intermittent) when feeling like having a flare of pelvci pain will have pain in lower back in back; no other chronic pain conditions   Also had a random nosebleed     Gynecologic History Patient's last menstrual period was 09/25/2022 (exact date). Last Pap: UTD, reports s/p colpo w/ biopsy last Friday  Last Mammogram: none seen on file Last Colonoscopy: n/a  Obstetric History OB History  Gravida Para Term Preterm AB Living  2 2 2  0 0 2  SAB IAB Ectopic Multiple Live Births  0 0 0  0      # Outcome Date GA Lbr Len/2nd Weight Sex Type Anes PTL Lv  2 Term           1 Term             Past Medical History:  Diagnosis Date   Healthcare maintenance 08/31/2013    Past Surgical History:  Procedure Laterality Date   CERVICAL BIOPSY  W/ LOOP ELECTRODE EXCISION Bilateral    WISDOM TOOTH EXTRACTION      Current Outpatient Medications on File Prior to Visit  Medication Sig Dispense Refill   Cholecalciferol (VITAMIN D3) 1.25 MG (50000 UT) CAPS Take 1 capsule (1.25 mg total) by mouth once a week. AFTER the last weekly pill, start a daily OTC Vitamin D3 up to 2,000units. (Patient not taking: Reported on 08/27/2022) 12 capsule 0   No current facility-administered medications on file prior to visit.    No Known Allergies  Social History:  reports that she has never smoked. She has never used smokeless tobacco. She reports that she does not drink alcohol and does not use drugs.  Family History  Problem Relation Age of Onset   Hypertension Mother    Hypertension Maternal Grandmother    Stroke Maternal Grandmother     The following portions of the patient's history were reviewed and updated as appropriate: allergies, current medications, past family  history, past medical history, past social history, past surgical history and problem list.  Review of Systems Pertinent items noted in HPI and remainder of comprehensive ROS otherwise negative.  Physical Exam:  BP 126/71   Pulse 84   Wt 151 lb 11.2 oz (68.8 kg)   LMP 09/25/2022 (Exact Date)   BMI 32.83 kg/m  Physical Exam Vitals and nursing note reviewed. Exam conducted with a chaperone present.  Constitutional:      Appearance: Normal appearance.  Cardiovascular:     Rate and Rhythm: Normal rate.  Pulmonary:     Effort: Pulmonary effort is normal.     Breath sounds: Normal breath sounds.  Genitourinary:    Comments: Normal appearing vulva Normal vulvar sensation bilaterally Nontender superficial pelvic floor  muscles Nontender ischial tuberosities bilaterally  Allodynia at introitus: No  Anal wink not present Posterior vaginal wall nontender Right levator ani 1/10 Right ischiococcygeous 1/10 Right obturator internus 3/10 Left levator ani 1/10 Left ischioccocygeous 1/10 Left obturator internus 1/10 Anterior vaginal wall nontender Uterus nontender   Neurological:     General: No focal deficit present.     Mental Status: She is alert and oriented to person, place, and time.  Psychiatric:        Mood and Affect: Mood normal.        Behavior: Behavior normal.        Thought Content: Thought content normal.        Judgment: Judgment normal.        Assessment and Plan:   1. Dyspareunia in female Pelvic myalgia on exam, suspect piriformis pain as well. Pelvic US to assess GYN structures. Referral to PFPT regarding myalgia.  - US PELVIC COMPLETE WITH TRANSVAGINAL; Future - Ambulatory referral to Physical Therapy  2. Urinary frequency Rule out infection with urine dip/culture today. May be secondary to pelvic dysfunction. PFPT can also be helpful for urinary frequency.   routine preventative health maintenance measures emphasized. Please refer to After Visit Summary for other counseling recommendations.   Follow-up: Return for GYN Follow Up.  I spent total of 25 minutes with this patient collecting history, completing exam, chart review and discussion of recommendations.       Lorriane Shire, MD Obstetrician & Gynecologist, [facility] [employer], [facility]

## 2022-10-15 ENCOUNTER — Ambulatory Visit (HOSPITAL_COMMUNITY)
Admission: RE | Admit: 2022-10-15 | Discharge: 2022-10-15 | Disposition: A | Discharge status: Home / Self Care | Payer: Medicaid Other | Source: Ambulatory Visit | Attending: Obstetrics and Gynecology | Admitting: Obstetrics and Gynecology

## 2022-10-15 DIAGNOSIS — N858 Other specified noninflammatory disorders of uterus: Secondary | ICD-10-CM | POA: Diagnosis not present

## 2022-10-15 DIAGNOSIS — D259 Leiomyoma of uterus, unspecified: Secondary | ICD-10-CM | POA: Diagnosis not present

## 2022-10-15 DIAGNOSIS — N941 Unspecified dyspareunia: Secondary | ICD-10-CM | POA: Diagnosis not present

## 2022-10-15 DIAGNOSIS — R102 Pelvic and perineal pain: Secondary | ICD-10-CM | POA: Diagnosis not present

## 2022-10-15 DIAGNOSIS — N939 Abnormal uterine and vaginal bleeding, unspecified: Secondary | ICD-10-CM | POA: Diagnosis not present

## 2022-12-10 ENCOUNTER — Ambulatory Visit: Payer: BC Managed Care – PPO | Attending: Obstetrics and Gynecology

## 2022-12-17 ENCOUNTER — Encounter: Payer: BC Managed Care – PPO | Admitting: Family

## 2022-12-17 ENCOUNTER — Telehealth: Payer: Self-pay | Admitting: Family

## 2022-12-17 NOTE — Telephone Encounter (Signed)
Called pt to see if she wanted to reschedule CPE since she didn't attend her appointment today. Pt apologized stating she thought she had canceled. She informed me she doesn't want to reschedule.

## 2023-03-31 ENCOUNTER — Other Ambulatory Visit: Payer: Self-pay

## 2023-03-31 ENCOUNTER — Ambulatory Visit
Admission: RE | Admit: 2023-03-31 | Discharge: 2023-03-31 | Disposition: A | Discharge status: Home / Self Care | Payer: Medicaid Other | Source: Ambulatory Visit | Attending: Family Medicine | Admitting: Family Medicine

## 2023-03-31 VITALS — BP 135/88 | HR 75 | Temp 97.8°F | Resp 16

## 2023-03-31 DIAGNOSIS — M546 Pain in thoracic spine: Secondary | ICD-10-CM | POA: Diagnosis not present

## 2023-03-31 LAB — POCT URINALYSIS DIP (MANUAL ENTRY)
Bilirubin, UA: NEGATIVE
Blood, UA: NEGATIVE
Glucose, UA: NEGATIVE mg/dL
Ketones, POC UA: NEGATIVE mg/dL
Nitrite, UA: NEGATIVE
Protein Ur, POC: NEGATIVE mg/dL
Spec Grav, UA: 1.01 (ref 1.010–1.025)
Urobilinogen, UA: 0.2 U/dL
pH, UA: 6 (ref 5.0–8.0)

## 2023-03-31 NOTE — ED Provider Notes (Signed)
 [facility]    CSN: [id number] Arrival date & time: 03/31/23  1549      History   Chief Complaint Chief Complaint  Patient presents with   Fatigue    Fatigue, back pain, frequent urine - Entered by patient    HPI Frances Hall is a 43 y.o. female.   The history is provided by the patient.  Bilateral midthoracic back pain since yesterday described as a constant throbbing worse with certain movements relieved with remaining still.  Denies injury.  Pain does not radiate.  Abdominal cramping yesterday resolved.  Admits urinary frequency irritation, had urgency which has resolved.  Change in color or odor of urine, vaginal discharge, irregular vaginal bleeding, concern for, fever, chills, sweats, nausea, vomiting, URI symptoms, known contacts with illness. 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 Authorizing Provider  Cholecalciferol (VITAMIN D3) 1.25 MG (50000 UT) CAPS Take 1 capsule (1.25 mg total) by mouth once a week. AFTER the last weekly pill, start a daily OTC Vitamin D3 up to 2,000units. Patient not taking: Reported on 08/27/2022 04/15/22   Lucius Krabbe, NP    Family History Family History  Problem Relation Age of Onset   Hypertension Mother     Hypertension Maternal Grandmother    Stroke Maternal Grandmother     Social History Social History   Tobacco Use   Smoking status: Never   Smokeless tobacco: Never  Vaping Use   Vaping status: Never Used  Substance Use Topics   Alcohol use: No   Drug use: No     Allergies   Patient has no known allergies.   Review of Systems Review of Systems  Constitutional:  Negative for fatigue and fever.  Genitourinary:  Positive for frequency. Negative for difficulty urinating, dysuria, hematuria, menstrual problem, vaginal bleeding, vaginal discharge and vaginal pain.  Musculoskeletal:  Positive for back pain.     Physical Exam Triage Vital Signs ED Triage Vitals  Encounter Vitals Group     BP 03/31/23 1600 135/88     Systolic BP Percentile --      Diastolic BP Percentile --      Pulse Rate 03/31/23 1600 75     Resp 03/31/23 1600 16     Temp 03/31/23 1600 97.8 F (36.6 C)     Temp Source 03/31/23 1600 Oral     SpO2 03/31/23 1600 98 %     Weight --      Height --      Head Circumference --  Peak Flow --      Pain Score 03/31/23 1557 5     Pain Loc --      Pain Education --      Exclude from Growth Chart --    No data found.  Updated Vital Signs BP 135/88 (BP Location: Right Arm)   Pulse 75   Temp 97.8 F (36.6 C) (Oral)   Resp 16   LMP 03/24/2023 (Exact Date)   SpO2 98%   Visual Acuity Right Eye Distance:   Left Eye Distance:   Bilateral Distance:    Right Eye Near:   Left Eye Near:    Bilateral Near:     Physical Exam Vitals and nursing note reviewed.  Constitutional:      Appearance: She is not ill-appearing.  HENT:     Head: Normocephalic and atraumatic.     Right Ear: Tympanic membrane normal.     Left Ear: Tympanic membrane normal.     Mouth/Throat:     Mouth: Mucous membranes are moist.     Pharynx: Oropharynx is clear.  Eyes:     Conjunctiva/sclera: Conjunctivae normal.  Cardiovascular:     Rate and Rhythm: Normal rate and regular  rhythm.     Heart sounds: Normal heart sounds.  Pulmonary:     Effort: Pulmonary effort is normal. No respiratory distress.     Breath sounds: Normal breath sounds. No wheezing, rhonchi or rales.  Abdominal:     General: Bowel sounds are normal.     Palpations: Abdomen is soft.     Tenderness: There is no abdominal tenderness. There is no right CVA tenderness, left CVA tenderness or guarding.  Skin:    General: Skin is warm and dry.  Neurological:     Mental Status: She is alert and oriented to person, place, and time.  Psychiatric:        Mood and Affect: Mood normal.      UC Treatments / Results  Labs (all labs ordered are listed, but only abnormal results are displayed) Labs Reviewed  POCT URINALYSIS DIP (MANUAL ENTRY)    EKG   Radiology No results found.  Procedures Procedures (including critical care time)  Medications Ordered in UC Medications - No data to display  Initial Impression / Assessment and Plan / UC Course  I have reviewed the triage vital signs and the nursing notes.  Pertinent labs & imaging results that were available during my care of the patient were reviewed by me and considered in my medical decision making (see chart for details).     43 year old female with bilateral thoracic leg pain since yesterday.  Had some urinary symptoms which have improved.  Denies injury.  Is well-appearing, vital signs stable, no CVA tenderness, abdominal exam benign.  Point-of-care urinalysis clear yellow normal specific gravity no glucose or ketones RBCs or nitrates has trace leukocytes.  Denies concern for STI but would like to be checked, vaginal swab only. Musculoskeletal pain recommend OTC Aleve , follow-up for new, worsening symptoms or concerns  Final Clinical Impressions(s) / UC Diagnoses   Final diagnoses:  None   Discharge Instructions   None    ED Prescriptions   None    PDMP not reviewed this encounter.   Rhea Kaelin, [place] 03/31/23  [phone]

## 2023-03-31 NOTE — ED Triage Notes (Signed)
 Pt c/o fatigue, mid-back pain, frequent urine/ irritation, and abdominal cramping for a few days

## 2023-03-31 NOTE — Discharge Instructions (Signed)
 Over-the-counter Aleve 2 tablets every 12 hours with food as needed for pain Follow-up with your doctor  Test results will be released to your MyChart account We will contact you if anything is positive and requires treatment.

## 2023-04-01 ENCOUNTER — Ambulatory Visit: Payer: Medicaid Other | Admitting: Family

## 2023-04-01 LAB — SPECIMEN STATUS REPORT

## 2023-04-03 LAB — NUSWAB VAGINITIS PLUS (VG+)
Atopobium vaginae: HIGH {score} — AB
BVAB 2: HIGH {score} — AB
Candida albicans, NAA: NEGATIVE
Candida glabrata, NAA: NEGATIVE
Chlamydia trachomatis, NAA: NEGATIVE
Megasphaera 1: HIGH {score} — AB
Neisseria gonorrhoeae, NAA: NEGATIVE
Trich vag by NAA: NEGATIVE

## 2023-04-03 LAB — SPECIMEN STATUS REPORT

## 2023-04-12 ENCOUNTER — Ambulatory Visit: Payer: Medicaid Other

## 2023-04-12 ENCOUNTER — Telehealth (HOSPITAL_BASED_OUTPATIENT_CLINIC_OR_DEPARTMENT_OTHER): Payer: Self-pay

## 2023-04-12 MED ORDER — METRONIDAZOLE 0.75 % VA GEL: 1.0000 | Freq: Every day | VAGINAL | 0 refills | Status: AC

## 2023-04-12 NOTE — Telephone Encounter (Signed)
Per ARuben Reason, PA, NuSwab Vaginitis Plus (VG+) was "positive for BV. Negative for all else."  Per protocol, pt requires tx with metronidazole. Reviewed with patient, verified pharmacy, prescription sent.

## 2023-11-30 ENCOUNTER — Ambulatory Visit: Payer: Self-pay | Admitting: Obstetrics and Gynecology

## 2023-12-08 ENCOUNTER — Ambulatory Visit: Admitting: Obstetrics & Gynecology

## 2024-01-05 ENCOUNTER — Ambulatory Visit: Admitting: Obstetrics and Gynecology

## 2024-01-05 ENCOUNTER — Other Ambulatory Visit: Payer: Self-pay

## 2024-01-05 ENCOUNTER — Other Ambulatory Visit (HOSPITAL_COMMUNITY)
Admission: RE | Admit: 2024-01-05 | Discharge: 2024-01-05 | Disposition: A | Discharge status: Home / Self Care | Source: Ambulatory Visit | Attending: Obstetrics and Gynecology | Admitting: Obstetrics and Gynecology

## 2024-01-05 VITALS — BP 133/86 | HR 80 | Wt 139.0 lb

## 2024-01-05 DIAGNOSIS — Z124 Encounter for screening for malignant neoplasm of cervix: Secondary | ICD-10-CM

## 2024-01-05 DIAGNOSIS — Z1331 Encounter for screening for depression: Secondary | ICD-10-CM

## 2024-01-05 DIAGNOSIS — N930 Postcoital and contact bleeding: Secondary | ICD-10-CM

## 2024-01-05 NOTE — Progress Notes (Signed)
 GYNECOLOGY VISIT  Patient name: Frances Hall MRN [id number]  Date of birth: 11-Oct-1980 Chief Complaint:   abnormal uterine bleeding  History:  Frances Hall  here for spotting. Intermenstrual spotting typically after intercourse (for years) and has increased to 3-4 days and will typically inbhibit with intercourse.  Has regular cycles; has not had spotting since most recent cycle. Menses is spotting for a few days and 3 days of moderate flow and changes with each bathroom visit   2 types of spotting; previously would have postcoital spotting and usually no intermenstrual spotting until last month. Only when wiping will see it; red, brown, or pink; has had issues with dripping to the bathrooom after intercourse. Other time will have spotting a day or two after intercourse. Pain with intercourse; not sure if it's due to dryness but doesn't have this happen each time.  Ok with STI testing today; no lubricant use with intercourse   Colposcopy mentioned in prior visit.     The following portions of the patient's history were reviewed and updated as appropriate: allergies, current medications, past family history, past medical history, past social history, past surgical history and problem list.   Health Maintenance:   Last pap No results found for: DIAGPAP, HPVHIGH, ADEQPAP  Health Maintenance  Topic Date Due   Hepatitis C Screening  Never done   DTaP/Tdap/Td vaccine (1 - Tdap) Never done   Hepatitis B Vaccine (1 of 3 - 19+ 3-dose series) Never done   HPV Vaccine (1 - 3-dose SCDM series) Never done   Pap with HPV screening  10/06/2016   Breast Cancer Screening  Never done   HIV Screening  Completed   Pneumococcal Vaccine  Aged Out   Meningitis B Vaccine  Aged Out   Flu Shot  Discontinued   COVID-19 Vaccine  Discontinued      Review of Systems:  Pertinent items are noted in HPI. Comprehensive review of systems was otherwise negative.   Objective:  Physical  Exam BP 133/86   Pulse 80   Wt 139 lb (63 kg)   LMP 12/28/2023 (Exact Date)   BMI 30.08 kg/m    Physical Exam Vitals and nursing note reviewed. Exam conducted with a chaperone present.  Constitutional:      Appearance: Normal appearance.  HENT:     Head: Normocephalic and atraumatic.  Pulmonary:     Effort: Pulmonary effort is normal.     Breath sounds: Normal breath sounds.  Genitourinary:    General: Normal vulva.     Exam position: Lithotomy position.     Vagina: Normal.     Cervix: Normal.     Comments: Friable area at 12 o'clock, slightly tender Skin:    General: Skin is warm and dry.  Neurological:     General: No focal deficit present.     Mental Status: She is alert.  Psychiatric:        Mood and Affect: Mood normal.        Behavior: Behavior normal.        Thought Content: Thought content normal.        Judgment: Judgment normal.       Assessment & Plan:  1. Postcoital bleeding (Primary) 2. Cervical cancer screening Now s/p pap smear and examination. Suspect bleeding may be coming from friable area on cervix. Recommend awaiting pap results, if normal, can apply silver nitrate to area. If abnormal, will need colposcopy and biopsy of area. If that does  not improve symptoms, will get pelvic US .  - Cytology - PAP    Carter Quarry, MD Minimally Invasive Gynecologic Surgery [facility] for [facility], [facility]

## 2024-01-10 LAB — CYTOLOGY - PAP
Chlamydia: NEGATIVE
Comment: NEGATIVE
Comment: NEGATIVE
Comment: NEGATIVE
Comment: NEGATIVE
Comment: NEGATIVE
Comment: NORMAL
HPV 16: NEGATIVE
HPV 18 / 45: NEGATIVE
High risk HPV: POSITIVE — AB
Neisseria Gonorrhea: NEGATIVE
Trichomonas: NEGATIVE

## 2024-01-11 ENCOUNTER — Ambulatory Visit: Payer: Self-pay | Admitting: Obstetrics and Gynecology

## 2024-02-04 ENCOUNTER — Ambulatory Visit: Admitting: Obstetrics and Gynecology

## 2024-03-01 ENCOUNTER — Ambulatory Visit: Admitting: Family

## 2024-03-02 ENCOUNTER — Other Ambulatory Visit: Payer: Self-pay

## 2024-03-02 ENCOUNTER — Ambulatory Visit: Admitting: Obstetrics and Gynecology

## 2024-03-02 VITALS — BP 133/90 | HR 83 | Wt 142.0 lb

## 2024-03-02 DIAGNOSIS — N930 Postcoital and contact bleeding: Secondary | ICD-10-CM | POA: Diagnosis not present

## 2024-03-02 DIAGNOSIS — R87612 Low grade squamous intraepithelial lesion on cytologic smear of cervix (LGSIL): Secondary | ICD-10-CM

## 2024-03-02 DIAGNOSIS — N941 Unspecified dyspareunia: Secondary | ICD-10-CM

## 2024-03-02 NOTE — Progress Notes (Signed)
 "   GYNECOLOGY VISIT  Patient name: Frances Hall MRN [id number]  Date of birth: May 20, 1980 Chief Complaint:   Procedure  History:  YASHICA STERBENZ Reports having a concern of the prolonged HPV presence. Wonderin gif there are other things that she can be tested for. Biggest conern is the pain she has been having for years. Wondering about numbing that can be done. Feels that there is something else causing inflammation. Reports being taken aback by the results and presence of the HPV Concerned that when she gets an exam and nothing gets resolved regarding her symptoms.  Wondering about posterior back wall of cervix - if pushing on stomach, it is constantly sore, has pain during sex that feels like something gets hit and then starts bleeding and feels it in the same spot being hit inside the vagina. Concerned it may be posterior back wall of cervix because also notes the pain when all fours during intercourse. Concerned that something has been missed  Interesed in numbing cream/gel in vagina Understand previously recommended PFPT but trying to do what she can at home - has not been consistent but wanted to do something at home due to being a busy mom. Doing pelvic tilt Biggest concern is the pain and cramping and does not feel able to do the colposcopy today  During pap felt paain but not the same pain she feels  The following portions of the patient's history were reviewed and updated as appropriate: allergies, current medications, past family history, past medical history, past social history, past surgical history and problem list.   Health Maintenance:   Last pap     Component Value Date/Time   DIAGPAP - Low grade squamous intraepithelial lesion (LSIL) (A) 01/05/2024 1712   HPVHIGH Positive (A) 01/05/2024 1712   ADEQPAP  01/05/2024 1712    Satisfactory for evaluation; transformation zone component PRESENT.    Health Maintenance  Topic Date Due   Hepatitis C Screening  Never  done   DTaP/Tdap/Td vaccine (1 - Tdap) Never done   Hepatitis B Vaccine (1 of 3 - 19+ 3-dose series) Never done   HPV Vaccine (1 - 3-dose SCDM series) Never done   Breast Cancer Screening  Never done   Pap with HPV screening  01/04/2029   HIV Screening  Completed   Pneumococcal Vaccine  Aged Out   Meningitis B Vaccine  Aged Out   Flu Shot  Discontinued   COVID-19 Vaccine  Discontinued      Review of Systems:  Pertinent items are noted in HPI. Comprehensive review of systems was otherwise negative.   Objective:  Physical Exam BP (!) 133/90   Pulse 83   Wt 142 lb (64.4 kg)   LMP 02/17/2024   BMI 30.73 kg/m    Physical Exam Vitals and nursing note reviewed.  Constitutional:      Appearance: Normal appearance.  HENT:     Head: Normocephalic and atraumatic.  Pulmonary:     Effort: Pulmonary effort is normal.  Skin:    General: Skin is warm and dry.  Neurological:     General: No focal deficit present.     Mental Status: She is alert.  Psychiatric:        Mood and Affect: Mood normal.        Behavior: Behavior normal.        Thought Content: Thought content normal.        Judgment: Judgment normal.      Labs and  Imaging FINDINGS: Uterus   Measurements: 9.2 x 5.1 x 5.8 cm = volume: 143.9 mL. Within the anterior upper uterine segment/fundus, there is a hypoechoic, mildly heterogeneous mass abuts the underlying endometrium. This measures 1.6 x 0.8 x 1.9 cm. No other uterine abnormality.   Endometrium   Thickness: 4 mm.  No focal abnormality visualized.   Right ovary   Measurements: 1.7 x 1.5 x 1.8 cm = volume: 2.5 mL. Normal appearance/no adnexal mass.   Left ovary   Measurements: 2.2 x 1.4 x 1.5 cm = volume: 2.5 mL. Normal appearance/no adnexal mass.   Other findings   No abnormal free fluid.   IMPRESSION: 1. 1.6 cm hypoechoic and heterogeneous mass in the anterior upper uterine segment/fundus abutting the underlying endometrium suspected to be  a small fibroid. This could potentially the source vaginal bleeding. 2. No other abnormality.     Electronically Signed   By: Alm Parkins M.D.   On: 10/22/2022 10:13     Assessment & Plan:  1. Postcoital bleeding (Primary) 2. Dyspareunia in female 3. Low grade squamous intraepithelial lesion on cytologic smear of cervix (LGSIL) Reviewed hx and discussed that based on pap and findings from prior examination, recommend colposcopy. Discussed options for in office vs OR - would prefer OR. Requesting any additional evaluation that can be done at that time will be done, given bleeding can also proceed with hysteroscopy and D&C for endometrial evaluation. Colposcopy with cervical biopsy to be completed at that time as well as thorough visualization of vaginal fornices and cervix.  - Ambulatory Referral For Surgery Scheduling    Ariv Penrod, MD Minimally Invasive Gynecologic Surgery [facility] for [facility], [facility] "

## 2024-03-03 ENCOUNTER — Ambulatory Visit: Admitting: Family

## 2024-03-09 ENCOUNTER — Ambulatory Visit: Admitting: Family

## 2024-03-28 ENCOUNTER — Ambulatory Visit: Admitting: Family

## 2024-04-06 ENCOUNTER — Telehealth: Payer: Self-pay

## 2024-04-06 NOTE — Telephone Encounter (Signed)
 I called patient to schedule surgery w/ Dr. Jeralyn. Patient requested to be scheduled on 05/18/24 at 3 pm. Pre-op instructions and surgery details were provided by phone.

## 2024-05-18 ENCOUNTER — Ambulatory Visit (HOSPITAL_COMMUNITY): Admit: 2024-05-18 | Admitting: Obstetrics and Gynecology

## 2024-05-18 SURGERY — DILATATION AND CURETTAGE /HYSTEROSCOPY
Anesthesia: Choice
